# Patient Record
Sex: Female | Born: 1960
Health system: Southern US, Community
[De-identification: ages and names within clinical notes are randomized; demographics above are authoritative.]

## PROBLEM LIST (undated history)

## (undated) DIAGNOSIS — K219 Gastro-esophageal reflux disease without esophagitis: Secondary | ICD-10-CM

## (undated) DIAGNOSIS — F419 Anxiety disorder, unspecified: Secondary | ICD-10-CM

## (undated) DIAGNOSIS — J189 Pneumonia, unspecified organism: Secondary | ICD-10-CM

## (undated) DIAGNOSIS — R51 Headache: Secondary | ICD-10-CM

## (undated) DIAGNOSIS — M1990 Unspecified osteoarthritis, unspecified site: Secondary | ICD-10-CM

## (undated) DIAGNOSIS — F329 Major depressive disorder, single episode, unspecified: Secondary | ICD-10-CM

## (undated) DIAGNOSIS — M797 Fibromyalgia: Secondary | ICD-10-CM

## (undated) DIAGNOSIS — U071 COVID-19: Secondary | ICD-10-CM

## (undated) DIAGNOSIS — J45909 Unspecified asthma, uncomplicated: Secondary | ICD-10-CM

## (undated) DIAGNOSIS — F32A Depression, unspecified: Secondary | ICD-10-CM

## (undated) HISTORY — PX: TONSILLECTOMY: SUR1361

## (undated) HISTORY — PX: TUBAL LIGATION: SHX77

## (undated) HISTORY — PX: OTHER SURGICAL HISTORY: SHX169

## (undated) HISTORY — PX: REFRACTIVE SURGERY: SHX103

## (undated) HISTORY — PX: TOE SURGERY: SHX1073

## (undated) HISTORY — PX: GANGLION CYST EXCISION: SHX1691

---

## 1998-09-16 ENCOUNTER — Other Ambulatory Visit: Admission: RE | Admit: 1998-09-16 | Discharge: 1998-09-16 | Payer: Self-pay | Admitting: *Deleted

## 2000-06-29 ENCOUNTER — Other Ambulatory Visit: Admission: RE | Admit: 2000-06-29 | Discharge: 2000-06-29 | Payer: Self-pay | Admitting: *Deleted

## 2001-09-11 ENCOUNTER — Other Ambulatory Visit: Admission: RE | Admit: 2001-09-11 | Discharge: 2001-09-11 | Payer: Self-pay | Admitting: *Deleted

## 2002-03-06 ENCOUNTER — Encounter: Admission: RE | Admit: 2002-03-06 | Discharge: 2002-03-06 | Payer: Self-pay | Admitting: Rheumatology

## 2002-03-06 ENCOUNTER — Encounter: Payer: Self-pay | Admitting: Rheumatology

## 2003-02-01 ENCOUNTER — Ambulatory Visit (HOSPITAL_COMMUNITY): Admission: RE | Admit: 2003-02-01 | Discharge: 2003-02-01 | Payer: Self-pay | Admitting: Gastroenterology

## 2003-06-05 ENCOUNTER — Encounter: Admission: RE | Admit: 2003-06-05 | Discharge: 2003-06-05 | Payer: Self-pay | Admitting: Rheumatology

## 2003-06-05 ENCOUNTER — Encounter: Payer: Self-pay | Admitting: Rheumatology

## 2004-10-05 ENCOUNTER — Emergency Department (HOSPITAL_COMMUNITY): Admission: EM | Admit: 2004-10-05 | Discharge: 2004-10-05 | Payer: Self-pay | Admitting: Emergency Medicine

## 2004-10-05 ENCOUNTER — Ambulatory Visit: Payer: Self-pay | Admitting: Psychiatry

## 2004-10-05 ENCOUNTER — Inpatient Hospital Stay (HOSPITAL_COMMUNITY): Admission: RE | Admit: 2004-10-05 | Discharge: 2004-10-07 | Payer: Self-pay | Admitting: Psychiatry

## 2005-06-10 ENCOUNTER — Other Ambulatory Visit: Admission: RE | Admit: 2005-06-10 | Discharge: 2005-06-10 | Payer: Self-pay | Admitting: Obstetrics and Gynecology

## 2005-07-14 ENCOUNTER — Ambulatory Visit: Payer: Self-pay | Admitting: Pulmonary Disease

## 2005-07-28 ENCOUNTER — Ambulatory Visit: Payer: Self-pay | Admitting: Pulmonary Disease

## 2005-08-10 ENCOUNTER — Ambulatory Visit: Payer: Self-pay | Admitting: Pulmonary Disease

## 2005-10-27 ENCOUNTER — Ambulatory Visit: Payer: Self-pay | Admitting: Pulmonary Disease

## 2006-01-20 ENCOUNTER — Ambulatory Visit: Payer: Self-pay | Admitting: Pulmonary Disease

## 2006-06-13 ENCOUNTER — Other Ambulatory Visit: Admission: RE | Admit: 2006-06-13 | Discharge: 2006-06-13 | Payer: Self-pay | Admitting: Obstetrics and Gynecology

## 2007-03-22 ENCOUNTER — Ambulatory Visit: Payer: Self-pay | Admitting: Pulmonary Disease

## 2007-05-01 ENCOUNTER — Ambulatory Visit: Payer: Self-pay | Admitting: Pulmonary Disease

## 2007-05-15 ENCOUNTER — Ambulatory Visit: Payer: Self-pay | Admitting: Pulmonary Disease

## 2007-06-27 ENCOUNTER — Other Ambulatory Visit: Admission: RE | Admit: 2007-06-27 | Discharge: 2007-06-27 | Payer: Self-pay | Admitting: Obstetrics and Gynecology

## 2007-07-11 ENCOUNTER — Ambulatory Visit: Payer: Self-pay | Admitting: Critical Care Medicine

## 2007-07-11 DIAGNOSIS — J45909 Unspecified asthma, uncomplicated: Secondary | ICD-10-CM

## 2007-07-11 DIAGNOSIS — J310 Chronic rhinitis: Secondary | ICD-10-CM

## 2007-08-07 ENCOUNTER — Ambulatory Visit: Payer: Self-pay | Admitting: Pulmonary Disease

## 2007-08-10 ENCOUNTER — Telehealth (INDEPENDENT_AMBULATORY_CARE_PROVIDER_SITE_OTHER): Payer: Self-pay | Admitting: *Deleted

## 2007-09-11 ENCOUNTER — Encounter: Payer: Self-pay | Admitting: Pulmonary Disease

## 2007-11-17 ENCOUNTER — Ambulatory Visit: Payer: Self-pay | Admitting: Family Medicine

## 2007-11-17 ENCOUNTER — Inpatient Hospital Stay (HOSPITAL_COMMUNITY): Admission: AD | Admit: 2007-11-17 | Discharge: 2007-11-19 | Payer: Self-pay | Admitting: Family Medicine

## 2007-11-28 ENCOUNTER — Ambulatory Visit: Payer: Self-pay | Admitting: Pulmonary Disease

## 2008-08-05 ENCOUNTER — Other Ambulatory Visit: Admission: RE | Admit: 2008-08-05 | Discharge: 2008-08-05 | Payer: Self-pay | Admitting: Obstetrics and Gynecology

## 2009-08-07 ENCOUNTER — Other Ambulatory Visit: Admission: RE | Admit: 2009-08-07 | Discharge: 2009-08-07 | Payer: Self-pay | Admitting: Obstetrics and Gynecology

## 2010-05-06 ENCOUNTER — Ambulatory Visit: Payer: Self-pay | Admitting: Internal Medicine

## 2010-05-07 ENCOUNTER — Telehealth (INDEPENDENT_AMBULATORY_CARE_PROVIDER_SITE_OTHER): Payer: Self-pay | Admitting: *Deleted

## 2010-06-05 ENCOUNTER — Telehealth (INDEPENDENT_AMBULATORY_CARE_PROVIDER_SITE_OTHER): Payer: Self-pay | Admitting: *Deleted

## 2010-06-09 DIAGNOSIS — M069 Rheumatoid arthritis, unspecified: Secondary | ICD-10-CM | POA: Insufficient documentation

## 2010-06-22 ENCOUNTER — Ambulatory Visit: Payer: Self-pay | Admitting: Pulmonary Disease

## 2010-08-13 ENCOUNTER — Other Ambulatory Visit
Admission: RE | Admit: 2010-08-13 | Discharge: 2010-08-13 | Payer: Self-pay | Source: Home / Self Care | Admitting: Obstetrics and Gynecology

## 2010-10-06 NOTE — Assessment & Plan Note (Signed)
Summary: Pulmonary/ acute ext ov with hfa 75% p coach start dulera 100  a  Copy to:  Zenovia Jordan Primary Provider/Referring Provider:  Dr. Renford Dills  CC:  Acute visit.  Pt c/o chest congestion and cough x 1 wk- cough is prod with green sputum.  She also c/o increased SOB x 3-4 days with or without any exertion.  Had some chest soreness a few days ago but this has resolved.  Marland Kitchen  History of Present Illness: 58 yowf never regular smoker with asthma as child then recurred in 20's with course complicated by RA intermittently requiring prednisone and difficulty affording symbicort which had been working well.    May 06, 2010  Acute visit.  Pt c/o chest congestion and cough x 1 wk- cough is prod with green sputum.  She also c/o increased SOB x 3-4 days with or without any exertion.  Had some chest soreness a few days ago but this has resolved.   taking omeprazole at hs. did not realize she could take tramadol for cough. using albuterol as a maintenance drug and no longer on symbicort, can't afford it.    Presently  denies any significant sore throat, dysphagia, itching, sneezing,  nasal congestion or excess secretions,  fever, chills, sweats, unintended wt loss, pleuritic or exertional cp, hempoptysis,  orthopnea pnd or leg swelling. Pt also denies any obvious fluctuation in symptoms with weather or environmental change or other alleviating or aggravating factors.       Current Medications (verified): 1)  Omeprazole 20 Mg Cpdr (Omeprazole) .Marland Kitchen.. 1 Once Daily 2)  Alprazolam 0.5 Mg  Tabs (Alprazolam) .... Take One Tab By Mouth At Bedtime 3)  Lexapro 10 Mg  Tabs (Escitalopram Oxalate) .... Take One Tab By Mouth Once Daily 4)  Trazodone Hcl 50 Mg  Tabs (Trazodone Hcl) .... Take Three Tabs By Mouth At Bedtime 5)  Albuterol Sulfate (2.5 Mg/61ml) 0.083%  Nebu (Albuterol Sulfate) .... One Per Nebulizer Qid 6)  Citalopram Hydrobromide 40 Mg Tabs (Citalopram Hydrobromide) .Marland Kitchen.. 1 1/2 At Bedtime 7)   Prednisone 5 Mg Tabs (Prednisone) .Marland Kitchen.. 1 Once Daily 8)  Kineret 100 Mg/0.41ml Soln (Anakinra) .Marland Kitchen.. 1 Inj Daily 9)  Plaquenil 200 Mg Tabs (Hydroxychloroquine Sulfate) .... 2 Once Daily 10)  Activella 0.5-0.1 Mg Tabs (Estradiol-Norethindrone Acet) .Marland Kitchen.. 1 Once Daily 11)  Tramadol Hcl 50 Mg Tabs (Tramadol Hcl) .Marland Kitchen.. 1 To 2 Every 8 Hrs As Needed  Allergies (verified): 1)  Penicillin  Past History:  Past Medical History: Asthma................................................................Marland KitchenSood    - HFA 75% p coaching May 06, 2010 Rheumatoid Arthritis.............................................Marland KitchenHawkes  Vital Signs:  Patient profile:   50 year old female Weight:      198.50 pounds BMI:     35.29 O2 Sat:      96 % on Room air Temp:     97.0 degrees F oral Pulse rate:   84 / minute BP sitting:   110 / 80  (left arm)  Vitals Entered By: Vernie Murders (May 06, 2010 9:50 AM)  O2 Flow:  Room air  Physical Exam  Additional Exam:  wt 165 > 198 May 06, 2010  obese amb wf with barking quality upper airway cough HEENT mild turbinate edema.  Oropharynx no thrush or excess pnd or cobblestoning.  No JVD or cervical adenopathy. Mild accessory muscle hypertrophy. Trachea midline, nl thryroid. Chest was hyperinflated by percussion with diminished breath sounds and moderate increased exp time without wheeze. Hoover sign positive at mid inspiration. Regular rate and  rhythm without murmur gallop or rub or increase P2 or edema.  Abd: no hsm, nl excursion. Ext warm without cyanosis or clubbing.     CXR  Procedure date:  05/06/2010  Findings:      Findings: Heart and mediastinal contours are within normal limits. No focal opacities or effusions.  No acute bony abnormality.   IMPRESSION: No acute cardiopulmonary disease.  Impression & Recommendations:  Problem # 1:  ASTHMA (ICD-493.90) Recurrent flares ? etiology DDX of  difficult airways managment all start with A and  include  Adherence, Ace Inhibitors, Acid Reflux, Active Sinus Disease, Alpha 1 Antitripsin deficiency, Anxiety masquerading as Airways dz,  ABPA,  allergy(esp in young), Aspiration (esp in elderly), Adverse effects of DPI,  Active smokers, plus one B  = Beta blocker use..   Adherence:  In this case Adherence is the biggest issue and starts with  inability to use HFA effectively and also  understand that SABA treats the symptoms but doesn't get to the underlying problem (inflammation).  I used  the ananology of putting steroid cream on a rash to help explain the meaning of topical therapy and the need to get the drug to the target tissue.   I spent extra time with the patient today explaining optimal mdi  technique.  This improved from  50-75% p coaching  ? Acid reflux:  using ppi at hs.  See instructions for specific recommendations   ? Active sinus dz > consider sinus ct if recurs on new regimen  Each maintenance medication was reviewed in detail including most importantly the difference between maintenance prns and under what circumstances the prns are to be used.  In addition, these two groups (for which the patient should keep up with refills) were distinguished from a third group :  meds that are used only short term with the intent to complete a course of therapy and then not refill them.  The med list was then fully reconciled and reorganized to reflect this important distinction.  See instructions for specific recommendations     Medications Added to Medication List This Visit: 1)  Omeprazole 20 Mg Cpdr (Omeprazole) .Marland Kitchen.. 1 once daily 2)  Omeprazole 20 Mg Cpdr (Omeprazole) .... Take  one 30-60 min before first meal of the day 3)  Dulera 100-5 Mcg/act Aero (Mometasone furo-formoterol fum) .... 2 puffs first thing  in am and 2 puffs again in pm about 12 hours later 4)  Citalopram Hydrobromide 40 Mg Tabs (Citalopram hydrobromide) .Marland Kitchen.. 1 1/2 at bedtime 5)  Kineret 100 Mg/0.81ml Soln (Anakinra) .Marland Kitchen.. 1 inj  daily 6)  Plaquenil 200 Mg Tabs (Hydroxychloroquine sulfate) .... 2 once daily 7)  Activella 0.5-0.1 Mg Tabs (Estradiol-norethindrone acet) .Marland Kitchen.. 1 once daily 8)  Tramadol Hcl 50 Mg Tabs (Tramadol hcl) .Marland Kitchen.. 1 to 2 every 4  hrs as needed for pain or cough 9)  Tramadol Hcl 50 Mg Tabs (Tramadol hcl) .Marland Kitchen.. 1 to 2 every 8 hrs as needed 10)  Albuterol Sulfate (2.5 Mg/76ml) 0.083% Nebu (Albuterol sulfate) .... Up to one every 4 hours  if needed for short of breath 11)  Avelox 400 Mg Tabs (Moxifloxacin hcl) .... One  daily x 6 days 12)  Prednisone 5 Mg Tabs (Prednisone) .... As per written instructions 13)  Prednisone 5 Mg Tabs (Prednisone) .Marland Kitchen.. 1 once daily  Other Orders: T-2 View CXR (71020TC) Est. Patient Level IV (65784) HFA Instruction [phone])  Patient Instructions: 1)  when coughing increase omeprazole Take one 30-60 min  before first and last meals of the day and pepcid 20 mg at bedtime  2)  GERD (REFLUX)  is a common cause of respiratory symptoms. It commonly presents without heartburn and can be treated with medication, but also with lifestyle changes including avoidance of late meals, excessive alcohol, smoking cessation, and avoid fatty foods, chocolate, peppermint, colas, red wine, and acidic juices such as orange juice. NO MINT OR MENTHOL PRODUCTS SO NO COUGH DROPS  3)  USE SUGARLESS CANDY INSTEAD (jolley ranchers)  4)  NO OIL BASED VITAMINS  5)  Start  dulera 100 2 puffs first thing  in am and 2 puffs again in pm about 12 hours later and see if you can afford it with the coupon 6)  for cough, use tramadol 7)  for wheeze, short of breath use neb albuterol but the goal is to use it less that twice daily 8)  Prednisone 5 mg 4 daily until better, then 2 daily x 3 days and 1 daily for 3 days and stop if you're taking it for resp symtoms 9)    Think of your medications in 3  categories and keep them separate:  10)  a)  The ones you take no matter what daily on a scheduled basis 11)  b)  The  ones you only take if needed for specific problems 12)  c)   The ones you take for a short course and stop, like antibiotics and prednisone. 13)  Please schedule a follow-up appointment in 6 weeks, sooner if needed with Dr Craige Cotta 14)    Prescriptions: ALBUTEROL SULFATE (2.5 MG/3ML) 0.083%  NEBU (ALBUTEROL SULFATE) up to one every 4 hours  if needed for short of breath  #25 x 11   Entered and Authorized by:   Nyoka Cowden MD   Signed by:   Nyoka Cowden MD on 05/06/2010   Method used:   Print then Give to Patient   RxID:   [id number]

## 2010-10-06 NOTE — Progress Notes (Signed)
Summary: sample of proair  Phone Note Call from Patient Call back at Home Phone [phone]   Caller: Patient Call For: sood Summary of Call: pt requests samples of albuterol "inhaler" (not for neb) Initial call taken by: Tivis Ringer, CNA,  June 05, 2010 10:44 AM  Follow-up for Phone Call        VS, please advise if pt can get a sample of proair/ventolin/proventil hfa.  There isn't one listed on her current med list.  Thank you.  Aundra Millet Reynolds LPN  June 05, 2010 11:02 AM   Additional Follow-up for Phone Call Additional follow up Details #1::        Please give her a sample of albuterol inhaler (any one that we have in sample closet is fine). Additional Follow-up by: Coralyn Helling MD,  June 05, 2010 11:28 AM    Additional Follow-up for Phone Call Additional follow up Details #2::    called and spoke with pt.  pt aware 1 sample of Proair left at front desk for pt to pick up.  Arman Filter LPN  June 05, 2010 11:39 AM   Proair  Lot # [id number]  Exp Nov 2012

## 2010-10-06 NOTE — Assessment & Plan Note (Signed)
Summary: 6 weeks/apc   Copy to:  Zenovia Jordan Primary Provider/Referring Provider:  Dr. Renford Dills  CC:  6 weeks follow up. Pt states breathing is better, little wheezing, and SOB with activity. Requesting flu vaccine.  History of Present Illness: 50 yo female with asthma and rheumatoid arthritis.  She has been doing better.  She does not have much cough.  She gets occasional wheeze.  She is not having chest pain, fever, sputum, hemoptysis, or sinus congestion.  Her arthritis has been stable on her current regimen.  She has not needed to use her proair much.  She is concerned about whether she can afford her inhaler medications.  Preventive Screening-Counseling & Management  Alcohol-Tobacco     Smoking Status: quit  Current Medications (verified): 1)  Omeprazole 20 Mg Cpdr (Omeprazole) .... Take  One 30-60 Min Before First Meal of The Day 2)  Dulera 100-5 Mcg/act Aero (Mometasone Furo-Formoterol Fum) .... 2 Puffs First Thing  in Am and 2 Puffs Again in Pm About 12 Hours Later 3)  Trazodone Hcl 50 Mg  Tabs (Trazodone Hcl) .... Take Three Tabs By Mouth At Bedtime 4)  Citalopram Hydrobromide 40 Mg Tabs (Citalopram Hydrobromide) .Marland Kitchen.. 1 1/2 At Bedtime 5)  Kineret 100 Mg/0.27ml Soln (Anakinra) .Marland Kitchen.. 1 Inj Daily 6)  Plaquenil 200 Mg Tabs (Hydroxychloroquine Sulfate) .... 2 Once Daily 7)  Activella 0.5-0.1 Mg Tabs (Estradiol-Norethindrone Acet) .Marland Kitchen.. 1 Once Daily 8)  Tramadol Hcl 50 Mg Tabs (Tramadol Hcl) .Marland Kitchen.. 1 To 2 Every 4  Hrs As Needed For Pain or Cough 9)  Alprazolam 0.5 Mg  Tabs (Alprazolam) .... Take One Tab By Mouth At Bedtime 10)  Albuterol Sulfate (2.5 Mg/31ml) 0.083%  Nebu (Albuterol Sulfate) .... Up To One Every 4 Hours  If Needed For Short of Breath 11)  Prednisone 5 Mg Tabs (Prednisone) .... As Per Written Instructions  Allergies (verified): 1)  Penicillin  Past History:  Past Medical  History: Asthma................................................................Marland KitchenSood Rheumatoid Arthritis.............................................Marland KitchenNickola Major  Family History: Family History Diabetes-Father, brother Family History Hypertension-mother Family History Hyperlipidemia-mother Family History Sarcoidosis-sister deceased @ 43 Family History Pulmonary Fibrosis-sister deceased @ 46 CHF-father, brother  Social History: Marital Status: married Children: 2 Occupation: Retired Health and safety inspector  Patient states former smoker. (1 pack a week x 6 months)  Vital Signs:  Patient profile:   50 year old female Height:      63 inches Weight:      203.4 pounds BMI:     36.16 O2 Sat:      96 % on Room air Temp:     98.0 degrees F oral Pulse rate:   79 / minute BP sitting:   110 / 82  (left arm) Cuff size:   large  Vitals Entered By: Zackery Barefoot CMA (June 22, 2010 3:08 PM)  O2 Flow:  Room air CC: 6 weeks follow up. Pt states breathing is better, little wheezing, SOB with activity. Requesting flu vaccine Comments Medications reviewed with patient Verified contact number and pharmacy with patient. Zackery Barefoot CMA  June 22, 2010 3:09 PM    Physical Exam  General:  normal appearance and obese.   Nose:  narrow nasal angles, no tenderness, no discharge Mouth:  no exudate Neck:  no JVD.   Lungs:  clear bilaterally to auscultation and percussion Heart:  regular rate and rhythm, S1, S2 without murmurs, rubs, gallops, or clicks Extremities:  no clubbing, cyanosis, edema, or deformity noted Neurologic:  normal CN II-XII and strength normal.   Cervical Nodes:  no significant adenopathy   Impression & Recommendations:  Problem # 1:  ASTHMA (ICD-493.90) She is stable at present.  She is concerned about the expense of her medication.  I will have her complete her current prescription for dulera, and then stop dulera.  I will start her on zafirlukast as this is generic.  I  explained that inhaled steroids are first line therapy for asthma, and that if her symptoms get worse with change in medicine she will need to restart inhaled steroids.  Advised her to call if her symptoms get worse.  Will give her flu shot today.  Problem # 2:  RHEUMATOID ARTHRITIS (ICD-714.0) She is to f/u with Dr. Nickola Major.  Medications Added to Medication List This Visit: 1)  Zafirlukast 20 Mg Tabs (Zafirlukast) .... One two times a day  Complete Medication List: 1)  Omeprazole 20 Mg Cpdr (Omeprazole) .... Take  one 30-60 min before first meal of the day 2)  Dulera 100-5 Mcg/act Aero (Mometasone furo-formoterol fum) .... 2 puffs first thing  in am and 2 puffs again in pm about 12 hours later 3)  Trazodone Hcl 50 Mg Tabs (Trazodone hcl) .... Take three tabs by mouth at bedtime 4)  Citalopram Hydrobromide 40 Mg Tabs (Citalopram hydrobromide) .Marland Kitchen.. 1 1/2 at bedtime 5)  Kineret 100 Mg/0.47ml Soln (Anakinra) .Marland Kitchen.. 1 inj daily 6)  Plaquenil 200 Mg Tabs (Hydroxychloroquine sulfate) .... 2 once daily 7)  Activella 0.5-0.1 Mg Tabs (Estradiol-norethindrone acet) .Marland Kitchen.. 1 once daily 8)  Tramadol Hcl 50 Mg Tabs (Tramadol hcl) .Marland Kitchen.. 1 to 2 every 4  hrs as needed for pain or cough 9)  Alprazolam 0.5 Mg Tabs (Alprazolam) .... Take one tab by mouth at bedtime 10)  Albuterol Sulfate (2.5 Mg/52ml) 0.083% Nebu (Albuterol sulfate) .... Up to one every 4 hours  if needed for short of breath 11)  Prednisone 5 Mg Tabs (Prednisone) .... As per written instructions 12)  Zafirlukast 20 Mg Tabs (Zafirlukast) .... One two times a day  Other Orders: Est. Patient Level IV (16109)  Patient Instructions: 1)  Finish current prescription for dulera 2)  Flu shot today 3)  Zafirlukast 20 mg two times a day 4)  Proair two puffs as needed 5)  follow up in 6 months Prescriptions: ZAFIRLUKAST 20 MG TABS (ZAFIRLUKAST) one two times a day  #60 x 6   Entered and Authorized by:   Coralyn Helling MD   Signed by:   Coralyn Helling MD on  06/22/2010   Method used:   Electronically to        [facility]  [place] [address] (retail)       [address]       [place], [place]  [address]       Ph: [phone] or [phone]       Fax: [phone]   RxID:   [id number]    Immunization History:  Influenza Immunization History:    Influenza:  historical (07/07/2009)    Appended Document: Orders Update    Clinical Lists Changes  Orders: Added new Service order of Admin 1st Vaccine (66440) - Signed Added new Service order of Flu Vaccine 71yrs + [phone]) - Signed Observations: Added new observation of FLU VAX VIS: 03/31/10 version (06/22/2010 16:25) Added new observation of FLU VAXLOT: [id number] (06/22/2010 16:25) Added new observation of FLU VAXMFR: [employer] (06/22/2010 16:25) Added new observation of FLU VAX EXP: 03/06/2011 (06/22/2010 16:25) Added new observation of FLU VAX DSE: 0.27ml (06/22/2010 16:25) Added new  observation of FLU VAX: Fluvax 3+ (06/22/2010 16:25)Flu Vaccine Consent Questions     Do you have a history of severe allergic reactions to this vaccine? no    Any prior history of allergic reactions to egg and/or gelatin? no    Do you have a sensitivity to the preservative Thimersol? no    Do you have a past history of Guillan-Barre Syndrome? no    Do you currently have an acute febrile illness? no    Have you ever had a severe reaction to latex? no    Vaccine information given and explained to patient? yes    Are you currently pregnant? no    Lot Number:[id number]   Exp Date:03/06/2011 Zackery Barefoot CMA  June 22, 2010 4:28 PM    Site Given  Right Deltoid IMw observation of FLU VAX DSE: 0.16ml (06/22/2010 16:25) Added new observation of FLU VAX: Fluvax 3+ (06/22/2010 16:25)     .lbflu

## 2010-10-06 NOTE — Progress Notes (Signed)
Summary: returning call to [facility] for cxr results  Phone Note Call from Patient Call back at Home Phone [phone]   Caller: Patient Call For: wert Summary of Call: returning a call to leslie Initial call taken by: Lacinda Axon,  May 07, 2010 12:43 PM  Follow-up for Phone Call        called spoke with patient, advised of cxr results that [facility] ATC her about.  cxr ok per MW, pt verbalized her understanding. Boone Master CNA/MA  May 07, 2010 2:27 PM

## 2011-01-19 NOTE — Assessment & Plan Note (Signed)
[facility]                             PULMONARY OFFICE NOTE   Lori [name], Lori [name]                       MRN:          [id number]  DATE:08/07/2007                            DOB:          1960/10/21    I saw the patient today in follow-up for her asthma and rhinitis.  Since  her last visit with me, she was seen by my nurse practitioner, Rubye Oaks, NP on August 04, 2007, and she apparently had been treated  for a left lower lobe pneumonia with a course of Avelox and prednisone.  She says that since then her symptoms have improved.  She still does  have some nasal congestion.  She says that she tried using a nebulizer  then which she said worked better than her inhaler.  She is currently  using her Ventolin 2-3 times a day.  She is also still having problems  with cough with production of clear to yellowish sputum and does  complain of dyspnea with activity such as vacuuming.   CURRENT MEDICATIONS:  1. Ventolin HFA two puffs q.i.d.  2. Nexium 40 mg daily.  3. Xanax 0.5 mg q.h.s.  4. Atrovent 250/50 one puff b.i.d.  5. Humira.  6. Lexapro 10 mg daily.  7. Trazodone 150 mg q.h.s.  8. Topamax 300 mg daily.  9. Veramyst.  10.Xyzal 5 mg daily.  11.Sulfasalazine.   PHYSICAL EXAMINATION:  VITAL SIGNS:  She is 166 pounds, temperature  98.2, blood pressure 112/80, heart rate 65, and oxygen saturation is 98%  on room air.  HEENT:  No sinus tenderness, no nasal discharge.  No oral lesions and no  lymphadenopathy.  HEART:  S1 and S2.  CHEST:  No wheezing or rales.  ABDOMEN:  Soft and nontender.  EXTREMITIES:  No edema.   Chest x-ray in my office showed no acute disease process.   IMPRESSION:  1. Asthma.  I will change her to Symbicort 160/4.5 two puffs b.i.d.      and have her discontinue her Advair.  She is to continue on      Ventolin HFA as needed.  I will also provide her with a      nebulizer as she did seem to do somewhat better with  this.  2. Rhinitis.  She is to continue on Xyzal and Veramyst.   I will follow up with her in approximately four months.     Coralyn Helling, MD  Electronically Signed    VS/MedQ  DD: 08/08/2007  DT: 08/08/2007  Job #: [id number]

## 2011-01-19 NOTE — Discharge Summary (Signed)
[name], [name]                ACCOUNT NO.:  [id number]   MEDICAL RECORD NO.:  [id number]          PATIENT TYPE:  INP   LOCATION:  5156                         FACILITY:  [facility]   PHYSICIAN:  Leighton Roach McDiarmid, M.D.DATE OF BIRTH:  1961-04-03   DATE OF ADMISSION:  11/17/2007  DATE OF DISCHARGE:  11/19/2007                               DISCHARGE SUMMARY   DISCHARGE DIAGNOSES:  1. Abdominal wall cellulitis, no abscess.  2. Rheumatoid arthritis, recently on Humira.  3. Depression/anxiety.  4. Asthma.  5. Migraines.   DISCHARGE MEDICATIONS:  1. Septra Double Strength, 1 tab p.o. b.i.d. times 8 days.  2. Cipro 750 mg, 1 tab p.o. t.i.d. for 9 days.  3. Trazodone 50 mg p.o. nightly.  4. Topamax 200 mg p.o. nightly.  5. Symbicort, 1 puff  b.i.d.  6. Ventolin inhaled p.r.n.  7. Lexapro 20 mg p.o. daily.  8. Xanax 0.25 mg p.o. nightly.  9. Percocet 5/325, 1 to 2 tabs p.o. q.6. hours p.r.n. pain.   FOLLOWUP:  Patient will see Dr. [name] on Monday, November 20, 2007.  The  patient will see Leann at [facility] on Tuesday,  March 17th and will call for an appointment.   ACUTE FOLLOWUP ISSUES:  The patient was being treated for cellulitis  which is improving with decreased erythema and induration.  Dr. [name]  and Geoffery Spruce should begin to pull out the Iodoform packing daily and  eventually remove it completely so the wound heals.  The patient is to  continue antibiotics.  Cultures are pending at [facility] currently and she  is doubly covered for presumed MRSA and/or Pseudomonas until cultures  are final.   HOSPITAL COURSE:  This is a 50 year old female who was admitted for  cellulitis on her abdomen.  She was started on IV antibiotics as she  failed outpatient antibiotics.  Throughout her hospital course, she has  done very well.  Please see following for details to describe her  course:  1. Cellulitis:  The cellulitis initially was pretty extensive and she      was  started on IV vancomycin after failing outpatient Bactrim.  We      then added Cipro p.o. to double cover her for Pseudomonas as she      was immunocompromised on her Humira medicine.  The patient's      cellulitis improved remarkably within 2 days.  We did obtain an      abdominal ultrasound to test for abscess and it was negative.      Therefore, the patient did not need further incision and drainage.      On day of discharge,  the cellulitis was much improved and we      transitioned her to p.o. antibiotics that she will continue for a      total of 10 days in the outpatient setting.  We discontinued her      Humira given the fact that she has an infection and her primary      care Merland Holness can start it when necessary.  Cultures are pending at  [place] so we are treating her for both MRSA infection  as well as      Pseudomonas.  The patient will follow up at [facility] as well.  The      patient's white blood cell count trended down during hospital stay      and was normal at day of discharge.  2. Rheumatoid arthritis:  The patient was on Humira which we held      during this hospital stay given her infectious state.  No other      changes were made.  Patient to follow up with primary care      Perlie Scheuring.  3. Depression/Anxiety:  Continue the patient's Lexapro and Xanax.  No      problems.  4. Asthma:  Continue the patient's Symbicort as well as her Albuterol      inhaler p.r.n.  No exacerbations during this hospital stay.  5. Migraines:  We continued the patient's Topamax nightly, no problems      with that.   IMAGES:  The patient had an abdominal ultrasound which showed no  abscess.   LABS:  Pertinent:  The patient's white cell count initially elevated at  12.3, 7.8 at discharge.  Hemoglobin normal.  All other labs were within  normal limits.  Cultures are pending at [facility].   The patient was discharged in improved and stable condition with close  followup.      Johney Maine, M.D.  Electronically Signed      Leighton Roach McDiarmid, M.D.  Electronically Signed    JT/MEDQ  D:  11/19/2007  T:  11/19/2007  Job:  [id number]   cc:   Demetria Pore. [name], M.D.  Sigmund I. Patsi Sears, M.D.  ATT: [facility]

## 2011-01-19 NOTE — H&P (Signed)
Lori [name], Lori [name]                ACCOUNT NO.:  [id number]   MEDICAL RECORD NO.:  [id number]          PATIENT TYPE:  INP   LOCATION:  5156                         FACILITY:  [facility]   PHYSICIAN:  Leighton Roach McDiarmid, M.D.DATE OF BIRTH:  01-May-1961   DATE OF ADMISSION:  11/17/2007  DATE OF DISCHARGE:                              HISTORY & PHYSICAL   PRIMARY CARE PHYSICIAN:  [name], M.D.   CHIEF COMPLAINT:  Abdominal abscess and cellulitis.   HISTORY OF PRESENT ILLNESS:  This is a 50 year old white female with  severe rheumatoid arthritis on Humira and sulfasalazine who presented on  November 15, 2007, to [facility] with lower abdominal skin  infection that started on Sunday, November 12, 2007.  At that time, the  patient had an area of erythema on her lower abdomen which measured  approximately 4 x 5 inches with a central area of induration but no  fluctuance, amenable to drainage.  The patient was started on Bactrim  double strength 1 tablet p.o. b.i.d. at that time.  Today, November 17, 2007, the patient returned to [facility] for follow for worsened erythema  and increased induration.  Dr. [name] reports the patient now has an area  of erythema measuring 27 x 12 cm with a central area of induration  measuring approximately 8 x 8 cm.  The abscess was incised and drained  in the office at [facility] with wound cultures sent, prior to the patient  being sent for admission for IV antibiotic therapy.  The patient reports  associated increased weakness and fatigue as well as warmth to the site  of her infection over the last two days.  She denies any frank fever or  chills.  Of note, the patient does report history of frequent  folliculitis type lesions.  Denies any household contacts with frequent  skin infections.   REVIEW OF SYSTEMS:  The patient denies fever, chills, sore throat,  congestion.  She does report a dry cough for the last two weeks chest,  which is improving.  She denies  any chest pain, shortness of breath,  nausea, vomiting, diarrhea, abdominal pain, hematuria, dysuria or rash,  headache, numbness, weakness or tingling.   PAST MEDICAL HISTORY:  1. Severe rheumatoid arthritis, managed by Dr. [name].  2. Migraines.  3. Depression/anxiety.  4. Asthma.  5. Status post multiple joint surgeries.   ALLERGIES:  PENICILLIN.   MEDICATIONS:  1. Trazodone 50 mg p.o. nightly.  2. Septra double strength 1 tablet p.o. b.i.d. since November 15, 2007.  3. Topamax 2 mg p.o. nightly.  4. Symbicort 1 puff inhaled b.i.d.  5. Ventolin 1 puff as needed.  6. Humira  injection every two weeks on Mondays.  The patient skipped      her most recent dose secondary to her infection.  7. Lexapro 20 mg p.o. nightly.  8. Xanax 0.25 mg p.o. nightly.  9. Azulfidine twice daily (dose unknown).   FAMILY HISTORY:  Significant for diabetes on both her mother and  father's side.  The patient's father had an MI at age 58  after a CABG in  his 1s, her sister has sarcoidosis.  She has three other siblings, one  of which has diabetes and peripheral arterial disease.   SOCIAL HISTORY:  The patient lives with her husband, daughter, son-in-  law and grandson.  She is on disability for her rheumatoid arthritis.  She denies any tobacco, alcohol or drug use.   PHYSICAL EXAMINATION:  VITAL SIGNS:  Temperature is 98.1, heart rate  120, respiratory rate 18, blood pressure 124/84, oxygen saturation 96%  on room air.  GENERAL APPEARANCE:  The patient is alert and oriented x3 in no acute  distress.  HEENT:  Head is normocephalic and atraumatic.  Pupils are equal, round  and reactive to light and accommodation.  Extraocular movements are  intact.  The patient has moist mucous membranes.  NECK:  Supple and nontender.  CARDIOVASCULAR:  Heart has regular rate and rhythm with no murmurs, rubs  or gallops and 2+ dorsalis pedis pulses bilaterally.  LUNGS:  Clear to auscultation bilaterally.  Normal  work of breathing and  no wheezes, rales or rhonchi.  ABDOMEN:  Normoactive bowel sounds, is soft, nontender and nondistended  with the exception of exquisite tenderness to palpation at the infection  site.  EXTREMITIES:  No clubbing, cyanosis or edema and only minimal rheumatoid  changes.  SKIN:  No rash.  Lower abdomen shows a large approximately 27 x 12 cm  area of erythema with central area of induration status post incision  and drainage with wick placement and dressing.  This area is exquisitely  tender to palpation.  NEUROLOGICAL:  Cranial nerves II-XII are grossly intact.  The patient  has normal sensation throughout and 5/5 strength throughout. She is  alert and oriented x3.   LABORATORY DATA:  CBC and basic metabolic panel are pending.  Wound  cultures obtained at [facility] today are also pending.   ASSESSMENT/PLAN:  This is a 36-year-old white female with likely  methicillin resistant Staphylococcus aureus abscess and browning  cellulitis which failed two days of oral Bactrim therapy.   1. Abscess and cellulitis:  The patient was only on Bactrim double      strength 1 tablet p.o. b.i.d. for two days.  However, given her      immunocompromised state on Humira therapy for her rheumatoid      arthritis, will admit for intravenous antibiotic therapy with      vancomycin for at least 24 hours.  The patient is already status      post incision and drainage for definitive treatment of the abscess      at [facility] earlier today.  Wound cultures are pending.      Will check CBC and BMET.  If patient continues to improve, will      consider restarting Bactrim double strength at two tablets p.o.      b.i.d. after at least 24 hours of intravenous antibiotics.  Will      give Percocet p.r.n. for pain as the patient is on Darvocet at home      and this has not been working adequately in the last few days.  2. Rheumatoid arthritis:  Will hold Humira given the patient's  acute      infection.  We will wait for the patient's daughter to bring in her      medications for Azulfidine dose.  3. Depression anxiety:  Will continue the patient's home medications.  4. Asthma:  Will continue the patient's  home medications.  5. Fluids, electrolytes, nutrition, gastrointestinal:  Will give the      patient regular diet and check basic metabolic panel to ensure      electrolytes are stable and renal function is normal.   DISPOSITION:  Pending improvement of #1.      Drue Dun, M.D.  Electronically Signed      Leighton Roach McDiarmid, M.D.  Electronically Signed    EE/MEDQ  D:  11/17/2007  T:  11/19/2007  Job:  [id number]

## 2011-01-19 NOTE — Assessment & Plan Note (Signed)
[facility]                             PULMONARY OFFICE NOTE   BIJAL, SIGLIN                       MRN:          [id number]  DATE:07/11/2007                            DOB:          11-Sep-1960    HISTORY OF PRESENT ILLNESS:  The patient is a 50 year old white female  of Dr. Craige Cotta who has a known history of asthma and rhinitis.  She  presents today for an acute office visit.  The patient complains that  over the last two weeks she had progressively worsening cough and  congestion, and was seen at Urgent Care on July 06, 2007.  She was  diagnosed with a left lower lobe pneumonia and started on Avelox for 10  days and a prednisone taper.  The patient reports that her symptoms are  much improved.  Coughing and congestion has improved substantially.  The  patient denies any hemoptysis, orthopnea, PND, or leg swelling.   PAST MEDICAL HISTORY:  Reviewed.   CURRENT MEDICATIONS:  Reviewed.   PHYSICAL EXAMINATION:  GENERAL:  The patient is a pleasant female in no  acute distress.  VITAL SIGNS:  She is afebrile with stable vital signs.  O2 saturations  98% on room air.  HEENT:  Unremarkable.  NECK:  Supple without cervical adenopathy.  LUNGS:  Sounds are clear without any wheezing or crackles.  HEART:  Regular rate and rhythm.  ABDOMEN:  Soft and nontender.  EXTREMITIES:  Warm without any calf cyanosis, clubbing, or edema.   IMPRESSION:  Recently diagnosed left lower lobe pneumonia.  The patient  does have a copy of her chest x-ray and we will review accordingly.  The  patient is to follow back up with Dr. Craige Cotta as scheduled in three weeks  and at that time will have a follow-up chest x-ray to verify resolution  of her left lower lobe infiltrate.  The patient is recommended to finish  her antibiotics and prednisone as recommended and follow back up with  Dr. Craige Cotta as scheduled.      Rubye Oaks, NP  Electronically Signed      Coralyn Helling, MD  Electronically Signed   TP/MedQ  DD: 07/11/2007  DT: 07/12/2007  Job #: [id number]

## 2011-01-19 NOTE — Assessment & Plan Note (Signed)
[facility]                             PULMONARY OFFICE NOTE   Lori [name], Lori [name]                       MRN:          [id number]  DATE:03/22/2007                            DOB:          10/21/1960    PULMONARY FOLLOWUP VISIT   I saw Lori [name] today in followup for her asthma.  She says that the  past several weeks, she has been noticing worsening of her breathing,  associated with chest tightness and cough productive of clear to yellow  sputum.  She has also been having sinus congestion with post-nasal drip.  She denied having any fevers, chills, or sweats.  She also denies any  abdominal pain, nausea, vomiting, or diarrhea.  She is not having any  leg swelling or skin rashes.   CURRENT MEDICATIONS:  1. Nexium 40 mg b.i.d.  2. Xanax 0.5 mg nightly  3. Advair 250/50 one puff b.i.d.  4. Lexapro 10 mg daily.  5. Trazodone nightly.  6. Ventolin HFA 3 to 4 times a day.  7. Topamax 300 mg daily.  8. Humira every 2 weeks.  9. Prednisone 7.5 mg daily.   PHYSICAL EXAM:  She is 180 pounds, temperature 97.8, blood pressure  106/68.  Heart rate is 65, oxygen saturation 99% on room air.  HEENT:  Mild maxillary sinus tenderness with a clear nasal discharge.  There was mild erythema to the posterior pharynx.  There is no  lymphadenopathy or thyromegaly.  She has a moon facies.  HEART:  S1, S2.  CHEST:  There are coarse breath sounds bilaterally, but no wheezing or  rales.  ABDOMEN:  Obese, soft, and nontender.  EXTREMITIES:  No edema.   CHEST X-RAY:  In my office today showed increased bronchial markings,  but no acute infiltrates.   IMPRESSION:  1. Acute asthmatic bronchitis.  I will give her a course of      azithromycin over 5 days.  I will also have her increase her dose      of prednisone to 40 mg and taper this over the next 5 days back to      her baseline dose of prednisone of 7.5 mg.  I would have her      continue on her inhaler regimen  of Advair 250/50 one puff b.i.d.      and Ventolin HFA.  I have given her samples of Advair, and I have      also given her samples of Proventil HFA, which I have advised her      she can use interchangeably with the Ventolin.  2. Symptoms of sleep apnea.  I discussed this with her again.  She      says that, due to feelings of claustrophobia, she is very reluctant      to undergo any evaluation for sleep apnea.  I have advised her to      reconsider this, and I will discuss this with her further at her      next followup.   I plan on following up with her in 2 months, but have  advised her to  call me earlier if her symptoms do not improve.     Coralyn Helling, MD  Electronically Signed    VS/MedQ  DD: 03/22/2007  DT: 03/23/2007  Job #: [id number]   cc:   Demetria Pore. [name], M.D.

## 2011-01-19 NOTE — Assessment & Plan Note (Signed)
[facility]                             PULMONARY OFFICE NOTE   Lori [name], Lori [name]                       MRN:          [id number]  DATE:05/15/2007                            DOB:          04-Feb-1961    SUBJECTIVE:  I saw Lori [name] today in followup for her asthma and  rhinitis.   She says that her symptoms have improved since she was started on  Veramyst and Xyzal as well as using the nasal sprays. She is still  having some coughing with production of white sputum but denies any  hemoptysis.  She is not having any problems as far as chest tightness or  wheezing.  She says that her sore throat has improved since she started  to rinse her mouth out after using the Advair.   MEDICATIONS:  Her medications were reviewed.   PHYSICAL EXAMINATION:  VITAL SIGNS:  She is 181 pounds.  Temperature  97.9. Blood pressure 90/70.  Heart rate is 82.  Oxygen saturation 95% on  room air.  HEENT:  There is no sinus tenderness.  There is no discharge.  There are  no oral lesions.  NECK:  No lymphadenopathy.  HEART:  S1, S2.  CHEST:  Clear to auscultation.  ABDOMEN:  Obese, soft, nontender.  EXTREMITIES: No edema.   IMPRESSION:  1. Asthma.  I will continue her on Advair 250/50 one puff b.i.d. and      ProAir HFA two puffs q.i.d. p.r.n.  2. Rhinitis.  She is to continue on the use of her Veramyst.  I have      instructed her on the use of nasal irrigation and she is to      continue with the use of Xyzal.  3. Possible sleep apnea.  She says that she is sleeping better at the      present time and would prefer to defer having a sleep test.   I will follow up with her in approximately four to six months.     Coralyn Helling, MD  Electronically Signed    VS/MedQ  DD: 05/15/2007  DT: 05/16/2007  Job #: [id number]   cc:   Lori [name]. Lori [name], M.D.

## 2011-01-19 NOTE — Assessment & Plan Note (Signed)
[facility]                             PULMONARY OFFICE NOTE   FAIGE, SEELY                       MRN:          [id number]  DATE:05/01/2007                            DOB:          July 25, 1961    HISTORY OF PRESENT ILLNESS:  The patient is a 50 year old white female  patient of Dr. Evlyn Courier who has a history of asthmatic bronchitis who  presents today for persistent cough and congestion.  The patient was  seen here 6 weeks ago with similar symptoms.  At that time, she was  given a Z-Pak and a prednisone burst.  The patient has a history of  rheumatoid arthritis and is on chronic steroids presently at 10 mg.  She  was also on Humira every 2 weeks.  The patient reports that her symptoms  did totally resolve for about 2 to 3 weeks.  However, over the last 3  weeks, the symptoms slowly returned.  The patient has had a couple of  episodes in which she had some green-tinged sputum.  However, this has  been inconsistent.  The patient denies any hemoptysis, orthopnea, PND,  or leg swelling.  The patient does have frequent post-nasal drip with  intermittent nose bleeds and frequent throat clearing.   PAST MEDICAL HISTORY:  Reviewed.   CURRENT MEDICATIONS:  Reviewed.   PHYSICAL EXAM:  The patient is a pleasant female in no acute distress.  She is afebrile with stable vital signs.  O2 saturation is 100% on room  air.  HEENT:  Nasal mucosa is erythematous.  Nontender sinuses.  Conjunctivae  not injected.  TMs normal.  Posterior pharynx is clear.  NECK:  Supple without cervical adenopathy.  The patient has frequent  throat-clearing throughout the exam.  No JVD.  LUNGS:  Sounds are clear without any wheezing or crackles.  CARDIAC:  Regular rate and rhythm.  ABDOMEN:  Soft and nontender.  EXTREMITIES:  Warm without any edema.   IMPRESSION AND PLAN:  Acute rhinitis flare.  The patient is to add in  Veramyst nasal spray 1 puff twice daily along with Xyzal  5 mg at  bedtime, along with saline nasal spray.  The patient  may use Mucinex DM or Delsym as needed for cough and congestion.  The  patient will return back with Dr. Craige Cotta in 2 weeks or sooner if needed.     Rubye Oaks, NP  Electronically Signed      Coralyn Helling, MD  Electronically Signed   TP/MedQ  DD: 05/01/2007  DT: 05/02/2007  Job #: [id number]

## 2011-01-22 NOTE — Op Note (Signed)
NAME:  Lori [name], Lori [name]                          ACCOUNT NO.:  [id number]   MEDICAL RECORD NO.:  [id number]                   PATIENT TYPE:  AMB   LOCATION:  ENDO                                 FACILITY:  [facility]   PHYSICIAN:  Danise Edge, M.D.                DATE OF BIRTH:  05-15-1961   DATE OF PROCEDURE:  02/01/2003  DATE OF DISCHARGE:                                 OPERATIVE REPORT   INDICATIONS FOR PROCEDURE:  The patient is a 50 year old female born on  Jul 04, 1961.  The patient has sero-positive nonnodular erosive  rheumatoid arthritis.  She is undergoing diagnostic  esophagogastroduodenoscopy and colonoscopy to evaluate unexplained weight  loss.   PROCEDURE:  Esophagogastroduodenoscopy and colonoscopy.   ENDOSCOPIST:  Danise Edge, M.D.   MEDICATIONS:  Versed 5 mg, Demerol 50 mg.   DESCRIPTION OF PROCEDURE:  Diagnostic esophagogastroduodenoscopy.  After obtaining informed consent,  the patient was placed in the left lateral decubitus position.  I  administered intravenous Demerol and intravenous Versed to achieve conscious  sedation for the procedure.  The patient's blood pressure, oxygen  saturation, and cardiac rhythm were monitored throughout the procedure and  documented in the medical records.  The Olympus gastroscope was passed  through the posterior hypopharynx and into the proximal esophagus without  difficulty.  The hypopharynx and larynx appeared normal.  I did not  visualize the vocal cords.   Esophagoscopy.  The proximal, mid, and lower segments of the esophageal  mucosa appear normal.   Gastroscopy.  Retroflexed view of the gastric cardia and fundus were normal.  The gastric body, antrum, and pyloris appears normal.   Duodenoscopy.  The duodenal bulb, mid duodenum, and distal duodenum appear  normal.   ASSESSMENT:  Normal esophagogastroduodenoscopy.   DESCRIPTION OF PROCEDURE:  Colonoscopy.  Anal inspection was normal.  Digital rectal  examination was normal.  The Olympus pediatric video  colonoscope was introduced into the rectum and it was advanced to the cecum.  A normal appearing ileocecal valve was intubated and the distal ileum  inspected.  Colonic preparation for the examination today was excellent.   Rectum normal.   Sigmoid colon and descending colon normal.   Splenic flexure normal.   Transverse colon normal.   Hepatic flexure normal.   Ascending colon normal.   Cecum and ileocecal valve normal.   Distal ileum normal.   ASSESSMENT:  Normal proctocolonoscopy to the cecum with distal ileal  inspection.  No endoscopic evidence for the presence of colorectal  neoplasia.                                               Danise Edge, M.D.    MJ/MEDQ  D:  02/01/2003  T:  02/02/2003  Job:  [id number]  cc:   Demetria Pore. [name], M.D.  [address]  [address]  [place]  [place] [address]  Fax: [phone]

## 2011-01-22 NOTE — H&P (Signed)
NAMENANNIE, STARZYK NO.:  [id number]   MEDICAL RECORD NO.:  [id number]          PATIENT TYPE:  IPS   LOCATION:  0504                          FACILITY:  [facility]   PHYSICIAN:  Geoffery Lyons, M.D.      DATE OF BIRTH:  [birth year]   DATE OF ADMISSION:  10/05/2004  DATE OF DISCHARGE:                         PSYCHIATRIC ADMISSION ASSESSMENT   This is a 50 year old voluntarily admitted on October 05, 2004.   HISTORY OF PRESENT ILLNESS:  The patient presents with a history of  depression for approximately one year, reports this Monday, prior to  admission, the patient was thinking about taking her pills, feeling very  overwhelmed with her medical problems.  The patient has a history of  rheumatoid arthritis.  The patient states that she needed to take some  paperwork to her doctor to get disability.  She does not want to get  disability, but she has been missing a significant amount of work, and they  are encouraging her to go ahead and apply for that.  She states that she  does not want to hurt herself.  She got very worried that she was having  those thoughts.  She has a very supportive family and her faith would  prevent her from self-harm.  She has only been sleeping about 3-4 hours per  night.  Her appetite has been satisfactory.  She denies any psychotic  symptoms.   PAST PSYCHIATRIC HISTORY:  First admission to [facility].  No  history of a suicide attempt.  She sees Valinda Hoar, N.P. as an  outpatient.   SOCIAL HISTORY:  She is a 50 year old married white female, married for 25  years with 2 children, 22 and 24.  She lives with her husband.  She has been  working for years at the post office.  No legal charges.   FAMILY HISTORY:  Brother with depression.   ALCOHOL AND DRUG HISTORY:  Nonsmoker.  Denies any alcohol or drug use.   PRIMARY CARE Treyvin Glidden:  Dr. [name], her rheumatologist in [place].  The  patient also has a neurologist, Dr.  Vela Prose.   MEDICAL PROBLEMS:  Rheumatoid arthritis and migraines.   MEDICATIONS:  Has been on:  1.  Wellbutrin XL 300 mg since December 2005.  2.  Lexapro 40 mg, has been on it for approximately 6-8 months.  3.  Topamax 100 mg in the morning, 300 mg at bedtime for prophylactic      migraine control.  4.  Darvocet for pain.  5.  Nexium 40 mg daily.  6.  Maxalt for headache pain.  7.  Humira injections once a week, taking them on Wednesday.   In the past, the patient has been on Zoloft, which she found effective.   DRUG ALLERGIES:  PENICILLIN.   The patient was assessed at [facility].  The patient is a middle  aged female in no acute distress, well-nourished, tearful.  Her temperature  today is 97.6, 69 heart rate, 20 respirations, blood pressure 128/80, 140  pounds, 5 feet 2 inches tall.  CBC is within normal  limits.  CMET within  normal limits.  Urine drug screen is negative.  Urinalysis is negative.   MENTAL STATUS EXAMINATION:  She is an alert middle-aged female, cooperative,  in no acute distress.  Speech is clear.  Patient is depressed.  Patient is  tearful throughout most of the interview.  Thought processes are coherent,  goal-directed.  No evidence of psychosis.  Cognitive function intact.  Memory is good.  Judgment and insight are fair.  She appears sincere.   AXIS I:  1.  Depressive disorder, not otherwise specified.  2.  Rule out adjustment disorder.   AXIS II:  Deferred.   AXIS III:  1.  Rheumatoid arthritis.  2.  Migraines headaches.   AXIS IV:  Problems with occupation, medical problems, possibly some other  psychosocial problems with loss of identity with loss of job role and other  adjustments with the patient's medical illness.   AXIS V:  Global assessment of functioning currently is 35 and past year is  70.   PLAN:  Admission for suicidal thoughts.  Contract for safety.  Stabilize  mood and thinking.  We will decrease the patient's Lexapro;  increased dosing  of Lexapro seems to be ineffective for the patient's depression.  We will  taper Lexapro and add Cymbalta.  We will encourage the patient to take 10 mg  of Ambien for sleep, as 5 mg is only offering patient about 3-4 hours of  sleep.  We will consider a family session with husband for support and  education.  Patient may need some individual therapy through her adjustment  and her disability.  The patient is to follow up with Valinda Hoar, N.P.,  her primary care Chrisa Hassan, and her current specialists for her medical  problems.   TENTATIVE LENGTH OF STAY:  Three to four days.      JO/MEDQ  D:  10/07/2004  T:  10/07/2004  Job:  [id number]

## 2011-01-22 NOTE — Discharge Summary (Signed)
NAMEJAYLAA, GALLION NO.:  [id number]   MEDICAL RECORD NO.:  [id number]          PATIENT TYPE:  IPS   LOCATION:  0504                          FACILITY:  [facility]   PHYSICIAN:  Geoffery Lyons, M.D.      DATE OF BIRTH:  [birth year]   DATE OF ADMISSION:  10/05/2004  DATE OF DISCHARGE:  10/07/2004                                 DISCHARGE SUMMARY   CHIEF COMPLAINT AND PRESENT ILLNESS:  This was the first admission to [facility] for this 50 year old female voluntarily admitted.  History of depression for a year.  Prior to admission, she was thinking  about taking her pills, feeling very overwhelmed, medical problems, history  of rheumatoid arthritis.  Endorsed that she needed to take some paperwork to  her doctor to get disability.  Se did not want to get disability.  She has  been missing a significant amount of work.  They were encouraging to go  ahead and apply for it.  Endorsed she did not want to hurt herself.  Got  very worried that she was having those thoughts.  Very supportive family.  Sleeping 3-4 hours per night.   PAST PSYCHIATRIC HISTORY:  First time at [facility].  No history of  suicide attempts.  Sees Valinda Hoar as an outpatient.   ALCOHOL/DRUG HISTORY:  Denies any active alcohol or substance use.   MEDICAL HISTORY:  Rheumatoid arthritis and migraines.   MEDICATIONS:  Wellbutrin XL 300 mg daily, Lexapro 40 mg daily, Topamax 100  mg in the morning, 200 mg at night, Darvocet as needed for pain, Nexium 40  mg daily, Maxalt for headaches, Humira injection once a week.   PHYSICAL EXAMINATION:  Performed and failed to show any acute findings.   LABORATORY DATA:  TSH 1.751.  Blood chemistries with SGOT 18, SGPT 21,  bilirubin 0.7.  Drug screen negative for substances of abuse.   MENTAL STATUS EXAM:  Alert female.  Cooperative.  No acute distress.  Speech  was clear, normal rate, tempo and production.  Mood was depressed.  Affect  depressed, tearful throughout the interview.  Thought processes were  logical, coherent and relevant.  No psychosis.  No active suicidal or  homicidal ideation.  Cognition was well-preserved.   ADMISSION DIAGNOSES:   AXIS I:  Depressive disorder not otherwise specified.   AXIS II:  No diagnosis.   AXIS III:  1.  Rheumatoid arthritis.  2.  Migraines.   AXIS IV:  Moderate.   AXIS V:  Global Assessment of Functioning upon admission 35; highest Global  Assessment of Functioning in the last year 70.   HOSPITAL COURSE:  She was admitted and started in individual and group  psychotherapy.  She was given Ambien for sleep. She was maintained on the  Darvocet, the Nexium, the Wellbutrin, Lexapro, Topamax, Maxalt and Ativan  0.5 mg every four hours as needed.  Ambien was placed at 10 mg at night as  needed for sleep.  Lexapro was decreased to 20 mg and she was started on  Cymbalta 30 mg per day.  She endorsed that she had been dealing with her  disability, unable to work, grieving the loss of her sister, losing her own  sense of self-worth and value as an __________ person.  Overwhelmed with all  the losses but felt she was safe to go home.  Felt that she was not going to  benefit from staying any longer.  She was willing to pursue the medication  changes.  She was going to continue follow-up with Valinda Hoar.  Endorsing no suicidal or homicidal ideation but the need to be home.   DISCHARGE DIAGNOSES:   AXIS I:  Depressive disorder not otherwise specified.   AXIS II:  No diagnosis.   AXIS III:  1.  Rheumatoid arthritis.  2.  Migraine headaches.   AXIS IV:  Moderate.   AXIS V:  Global Assessment of Functioning upon discharge 50.   DISCHARGE MEDICATIONS:  1.  Topamax 100 mg in the morning and 300 mg at night.  2.  Lexapro 10 mg per day.  3.  Cymbalta 30 mg daily.  4.  Wellbutrin XL 30 mg daily.  5.  Nexium 40 mg daily.  6.  Maxalt as needed for headache.  7.  Humira  injection as directed.  8.  Darvocet as needed for pain.  9.  Ambien 5 mg at night for sleep.   FOLLOW UP:  Valinda Hoar, who is going to optimize treatment with the  medication.  Being discharged on Cymbalta, Wellbutrin and Lexapro with  expectations for most possibly Lexapro to be tapered down and discontinued.     IL/MEDQ  D:  11/03/2004  T:  11/04/2004  Job:  [id number]

## 2011-05-31 LAB — CBC
HCT: 32.9 — ABNORMAL LOW
HCT: 34.1 — ABNORMAL LOW
HCT: 36.1
Hemoglobin: 11.1 — ABNORMAL LOW
Hemoglobin: 11.5 — ABNORMAL LOW
MCV: 93
MCV: 93.9
Platelets: 208
Platelets: 281
RBC: 3.66 — ABNORMAL LOW
RDW: 12.7
RDW: 12.9
WBC: 12.3 — ABNORMAL HIGH
WBC: 7.8

## 2011-05-31 LAB — BASIC METABOLIC PANEL
BUN: 9
BUN: 10
Calcium: 8.8
Chloride: 108
Chloride: 112
Creatinine, Ser: 0.7
GFR calc Af Amer: >60
GFR calc non Af Amer: >60
Glucose, Bld: 84
Glucose, Bld: 117 — ABNORMAL HIGH
Potassium: 4
Potassium: 4.1
Potassium: 4.4
Sodium: 135
Sodium: 136

## 2011-08-18 ENCOUNTER — Other Ambulatory Visit (HOSPITAL_COMMUNITY)
Admission: RE | Admit: 2011-08-18 | Discharge: 2011-08-18 | Disposition: A | Discharge status: Home / Self Care | Payer: Self-pay | Source: Ambulatory Visit | Attending: Obstetrics and Gynecology | Admitting: Obstetrics and Gynecology

## 2011-08-18 ENCOUNTER — Other Ambulatory Visit: Payer: Self-pay | Admitting: Obstetrics and Gynecology

## 2011-08-18 DIAGNOSIS — Z01419 Encounter for gynecological examination (general) (routine) without abnormal findings: Secondary | ICD-10-CM | POA: Insufficient documentation

## 2011-11-29 ENCOUNTER — Other Ambulatory Visit (HOSPITAL_COMMUNITY): Payer: Self-pay | Admitting: *Deleted

## 2011-12-02 ENCOUNTER — Encounter (HOSPITAL_COMMUNITY)
Admission: RE | Admit: 2011-12-02 | Discharge: 2011-12-02 | Disposition: A | Discharge status: Home / Self Care | Payer: Federal, State, Local not specified - PPO | Source: Ambulatory Visit | Attending: Rheumatology | Admitting: Rheumatology

## 2011-12-02 DIAGNOSIS — M069 Rheumatoid arthritis, unspecified: Secondary | ICD-10-CM | POA: Insufficient documentation

## 2011-12-02 MED ORDER — SODIUM CHLORIDE 0.9 % IV SOLN
Freq: Once | INTRAVENOUS | Status: AC
  Administered 2011-12-02: 13:00:00 via INTRAVENOUS

## 2011-12-02 MED ORDER — TOCILIZUMAB 400 MG/20ML IV SOLN
4.0000 mg/kg | INTRAVENOUS | Status: DC
  Administered 2011-12-02: 306 mg via INTRAVENOUS
  Filled 2011-12-02: qty 15.3

## 2011-12-29 ENCOUNTER — Other Ambulatory Visit (HOSPITAL_COMMUNITY): Payer: Self-pay | Admitting: *Deleted

## 2011-12-30 ENCOUNTER — Encounter (HOSPITAL_COMMUNITY)
Admission: RE | Admit: 2011-12-30 | Discharge: 2011-12-30 | Disposition: A | Discharge status: Home / Self Care | Payer: Federal, State, Local not specified - PPO | Source: Ambulatory Visit | Attending: Rheumatology | Admitting: Rheumatology

## 2011-12-30 DIAGNOSIS — M069 Rheumatoid arthritis, unspecified: Secondary | ICD-10-CM | POA: Insufficient documentation

## 2011-12-30 MED ORDER — SODIUM CHLORIDE 0.9 % IV SOLN
Freq: Once | INTRAVENOUS | Status: AC
  Administered 2011-12-30: 250 mL via INTRAVENOUS

## 2011-12-30 MED ORDER — SODIUM CHLORIDE 0.9 % IV SOLN
4.0000 mg/kg | INTRAVENOUS | Status: DC
  Administered 2011-12-30: 306 mg via INTRAVENOUS
  Filled 2011-12-30: qty 15.3

## 2012-01-14 ENCOUNTER — Encounter (HOSPITAL_COMMUNITY): Payer: Federal, State, Local not specified - PPO

## 2012-01-28 ENCOUNTER — Encounter (HOSPITAL_COMMUNITY)
Admission: RE | Admit: 2012-01-28 | Discharge: 2012-01-28 | Disposition: A | Discharge status: Home / Self Care | Payer: Federal, State, Local not specified - PPO | Source: Ambulatory Visit | Attending: Rheumatology | Admitting: Rheumatology

## 2012-01-28 DIAGNOSIS — M069 Rheumatoid arthritis, unspecified: Secondary | ICD-10-CM | POA: Insufficient documentation

## 2012-01-28 MED ORDER — TOCILIZUMAB 400 MG/20ML IV SOLN
4.0000 mg/kg | INTRAVENOUS | Status: DC
  Administered 2012-01-28: 304 mg via INTRAVENOUS
  Filled 2012-01-28: qty 15.2

## 2012-10-09 ENCOUNTER — Other Ambulatory Visit: Payer: Self-pay | Admitting: Obstetrics and Gynecology

## 2012-10-09 ENCOUNTER — Other Ambulatory Visit (HOSPITAL_COMMUNITY)
Admission: RE | Admit: 2012-10-09 | Discharge: 2012-10-09 | Disposition: A | Discharge status: Home / Self Care | Payer: Federal, State, Local not specified - PPO | Source: Ambulatory Visit | Attending: Obstetrics and Gynecology | Admitting: Obstetrics and Gynecology

## 2012-10-09 DIAGNOSIS — Z1151 Encounter for screening for human papillomavirus (HPV): Secondary | ICD-10-CM | POA: Insufficient documentation

## 2012-10-09 DIAGNOSIS — Z01419 Encounter for gynecological examination (general) (routine) without abnormal findings: Secondary | ICD-10-CM | POA: Insufficient documentation

## 2014-04-16 ENCOUNTER — Other Ambulatory Visit: Payer: Self-pay | Admitting: Gastroenterology

## 2014-04-19 ENCOUNTER — Encounter (HOSPITAL_COMMUNITY): Payer: Self-pay | Admitting: Pharmacy Technician

## 2014-04-23 ENCOUNTER — Encounter (HOSPITAL_COMMUNITY): Payer: Self-pay | Admitting: *Deleted

## 2014-04-30 ENCOUNTER — Encounter (HOSPITAL_COMMUNITY): Payer: Self-pay | Admitting: *Deleted

## 2014-04-30 ENCOUNTER — Ambulatory Visit (HOSPITAL_COMMUNITY): Payer: Federal, State, Local not specified - PPO | Admitting: Anesthesiology

## 2014-04-30 ENCOUNTER — Encounter (HOSPITAL_COMMUNITY): Payer: Federal, State, Local not specified - PPO | Admitting: Anesthesiology

## 2014-04-30 ENCOUNTER — Encounter (HOSPITAL_COMMUNITY)
Admission: RE | Disposition: A | Discharge status: Home / Self Care | Payer: Self-pay | Source: Ambulatory Visit | Attending: Gastroenterology

## 2014-04-30 ENCOUNTER — Ambulatory Visit (HOSPITAL_COMMUNITY)
Admission: RE | Admit: 2014-04-30 | Discharge: 2014-04-30 | Disposition: A | Discharge status: Home / Self Care | Payer: Federal, State, Local not specified - PPO | Source: Ambulatory Visit | Attending: Gastroenterology | Admitting: Gastroenterology

## 2014-04-30 DIAGNOSIS — Z885 Allergy status to narcotic agent status: Secondary | ICD-10-CM | POA: Diagnosis not present

## 2014-04-30 DIAGNOSIS — K219 Gastro-esophageal reflux disease without esophagitis: Secondary | ICD-10-CM | POA: Insufficient documentation

## 2014-04-30 DIAGNOSIS — F329 Major depressive disorder, single episode, unspecified: Secondary | ICD-10-CM | POA: Insufficient documentation

## 2014-04-30 DIAGNOSIS — H269 Unspecified cataract: Secondary | ICD-10-CM | POA: Insufficient documentation

## 2014-04-30 DIAGNOSIS — G43109 Migraine with aura, not intractable, without status migrainosus: Secondary | ICD-10-CM | POA: Diagnosis not present

## 2014-04-30 DIAGNOSIS — J45909 Unspecified asthma, uncomplicated: Secondary | ICD-10-CM | POA: Insufficient documentation

## 2014-04-30 DIAGNOSIS — IMO0001 Reserved for inherently not codable concepts without codable children: Secondary | ICD-10-CM | POA: Insufficient documentation

## 2014-04-30 DIAGNOSIS — Z1211 Encounter for screening for malignant neoplasm of colon: Secondary | ICD-10-CM | POA: Insufficient documentation

## 2014-04-30 DIAGNOSIS — F411 Generalized anxiety disorder: Secondary | ICD-10-CM | POA: Insufficient documentation

## 2014-04-30 DIAGNOSIS — F3289 Other specified depressive episodes: Secondary | ICD-10-CM | POA: Insufficient documentation

## 2014-04-30 DIAGNOSIS — M069 Rheumatoid arthritis, unspecified: Secondary | ICD-10-CM | POA: Diagnosis not present

## 2014-04-30 DIAGNOSIS — Z880 Allergy status to penicillin: Secondary | ICD-10-CM | POA: Insufficient documentation

## 2014-04-30 HISTORY — PX: ESOPHAGOGASTRODUODENOSCOPY (EGD) WITH PROPOFOL: SHX5813

## 2014-04-30 HISTORY — DX: Unspecified osteoarthritis, unspecified site: M19.90

## 2014-04-30 HISTORY — DX: Unspecified asthma, uncomplicated: J45.909

## 2014-04-30 HISTORY — DX: Gastro-esophageal reflux disease without esophagitis: K21.9

## 2014-04-30 HISTORY — DX: Anxiety disorder, unspecified: F41.9

## 2014-04-30 HISTORY — DX: Fibromyalgia: M79.7

## 2014-04-30 HISTORY — DX: Depression, unspecified: F32.A

## 2014-04-30 HISTORY — PX: COLONOSCOPY WITH PROPOFOL: SHX5780

## 2014-04-30 HISTORY — DX: Headache: R51

## 2014-04-30 HISTORY — DX: Major depressive disorder, single episode, unspecified: F32.9

## 2014-04-30 SURGERY — COLONOSCOPY WITH PROPOFOL
Anesthesia: Monitor Anesthesia Care

## 2014-04-30 MED ORDER — PROPOFOL 10 MG/ML IV BOLUS
INTRAVENOUS | Status: AC
  Filled 2014-04-30: qty 20

## 2014-04-30 MED ORDER — KETAMINE HCL 10 MG/ML IJ SOLN
INTRAMUSCULAR | Status: DC | PRN
  Administered 2014-04-30: 20 mg via INTRAVENOUS

## 2014-04-30 MED ORDER — LACTATED RINGERS IV SOLN
INTRAVENOUS | Status: DC
  Administered 2014-04-30: 11:00:00 via INTRAVENOUS

## 2014-04-30 MED ORDER — SODIUM CHLORIDE 0.9 % IV SOLN: INTRAVENOUS | Status: DC

## 2014-04-30 MED ORDER — BUTAMBEN-TETRACAINE-BENZOCAINE 2-2-14 % EX AERO
INHALATION_SPRAY | CUTANEOUS | Status: DC | PRN
  Administered 2014-04-30: 1 via TOPICAL

## 2014-04-30 MED ORDER — ONDANSETRON HCL 4 MG/2ML IJ SOLN
INTRAMUSCULAR | Status: DC | PRN
  Administered 2014-04-30: 4 mg via INTRAVENOUS

## 2014-04-30 MED ORDER — LIDOCAINE HCL (CARDIAC) 20 MG/ML IV SOLN
INTRAVENOUS | Status: DC | PRN
  Administered 2014-04-30: 100 mg via INTRAVENOUS

## 2014-04-30 MED ORDER — ONDANSETRON HCL 4 MG/2ML IJ SOLN
INTRAMUSCULAR | Status: AC
  Filled 2014-04-30: qty 2

## 2014-04-30 MED ORDER — PROPOFOL INFUSION 10 MG/ML OPTIME
INTRAVENOUS | Status: DC | PRN
  Administered 2014-04-30: 200 ug/kg/min via INTRAVENOUS

## 2014-04-30 MED ORDER — LIDOCAINE HCL (CARDIAC) 20 MG/ML IV SOLN
INTRAVENOUS | Status: AC
  Filled 2014-04-30: qty 5

## 2014-04-30 MED ORDER — MIDAZOLAM HCL 2 MG/2ML IJ SOLN
INTRAMUSCULAR | Status: AC
  Filled 2014-04-30: qty 2

## 2014-04-30 MED ORDER — MIDAZOLAM HCL 5 MG/5ML IJ SOLN
INTRAMUSCULAR | Status: DC | PRN
  Administered 2014-04-30 (×2): 1 mg via INTRAVENOUS

## 2014-04-30 SURGICAL SUPPLY — 24 items

## 2014-04-30 NOTE — Anesthesia Preprocedure Evaluation (Addendum)
Anesthesia Evaluation  Patient identified by MRN, date of birth, ID band Patient awake    Reviewed: Allergy & Precautions, H&P , NPO status , Patient's Chart, lab work & pertinent test results  Airway Mallampati: II TM Distance: >3 FB Neck ROM: full    Dental no notable dental hx. (+) Teeth Intact, Dental Advisory Given   Pulmonary neg pulmonary ROS, asthma ,  breath sounds clear to auscultation  Pulmonary exam normal       Cardiovascular Exercise Tolerance: Good negative cardio ROS  Rhythm:regular Rate:Normal     Neuro/Psych  Headaches, Anxiety Depression negative neurological ROS  negative psych ROS   GI/Hepatic negative GI ROS, Neg liver ROS, GERD-  Medicated and Controlled,  Endo/Other  negative endocrine ROS  Renal/GU negative Renal ROS  negative genitourinary   Musculoskeletal  (+) Arthritis -, Rheumatoid disorders,    Abdominal   Peds  Hematology negative hematology ROS (+)   Anesthesia Other Findings   Reproductive/Obstetrics negative OB ROS                          Anesthesia Physical Anesthesia Plan  ASA: III  Anesthesia Plan: MAC   Post-op Pain Management:    Induction:   Airway Management Planned:   Additional Equipment:   Intra-op Plan:   Post-operative Plan:   Informed Consent: I have reviewed the patients History and Physical, chart, labs and discussed the procedure including the risks, benefits and alternatives for the proposed anesthesia with the patient or authorized representative who has indicated his/her understanding and acceptance.   Dental Advisory Given  Plan Discussed with: CRNA and Surgeon  Anesthesia Plan Comments:         Anesthesia Quick Evaluation

## 2014-04-30 NOTE — Op Note (Signed)
Procedure: Screening colonoscopy. Diagnostic esophagogastroduodenoscopy to evaluate chronic heartburn. Normal screening colonoscopy and diagnostic esophagogastroduodenoscopy performed on 02/01/2003  Endoscopist: Danise Edge  Premedication: Propofol administered by anesthesia  Procedure: Diagnostic esophagogastroduodenoscopy The patient was placed in the left lateral decubitus position. The Pentax gastroscope was passed through the posterior hypopharynx into the proximal esophagus without difficulty. The hypopharynx, larynx, and vocal cords appeared normal.  Esophagoscopy: The proximal, mid, and lower segments of the esophageal mucosa appeared normal. The squamocolumnar junction was regular in appearance and noted at 36 cm from the incisor teeth.  Gastroscopy: Retroflex view of the gastric cardia and fundus was normal. The gastric body, antrum, and pylorus appeared normal.  Duodenoscopy: The duodenal bulb and descending duodenum appeared normal.  Assessment: Normal esophagogastroduodenoscopy  Procedure: Screening colonoscopy Anal inspection and digital rectal exam were normal. The Pentax pediatric colonoscope was introduced into the rectum and advanced to the cecum. A normal-appearing appendiceal orifice was visualized. A normal-appearing ileocecal valve was intubated and the terminal ileum inspected. Colonic preparation for the exam today was good.  Rectum. Normal. Retroflexed view of the distal rectum normal  Sigmoid colon and descending colon. Normal  Splenic flexure. Normal  Transverse colon. Normal  Hepatic flexure. Normal  Ascending colon. Normal  Cecum and ileocecal valve. Normal  Terminal ileum. Normal  Assessment: Normal screening colonoscopy  Recommendation: Schedule repeat screening colonoscopy in 10 years

## 2014-04-30 NOTE — Anesthesia Postprocedure Evaluation (Signed)
  Anesthesia Post-op Note  Patient: Lori [name]  Procedure(s) Performed: Procedure(s) (LRB): COLONOSCOPY WITH PROPOFOL (N/A) ESOPHAGOGASTRODUODENOSCOPY (EGD) WITH PROPOFOL (N/A)  Patient Location: PACU  Anesthesia Type: MAC  Level of Consciousness: awake and alert   Airway and Oxygen Therapy: Patient Spontanous Breathing  Post-op Pain: mild  Post-op Assessment: Post-op Vital signs reviewed, Patient's Cardiovascular Status Stable, Respiratory Function Stable, Patent Airway and No signs of Nausea or vomiting  Last Vitals:  Filed Vitals:   04/30/14 1230  BP: 136/83  Pulse: 70  Temp:   Resp: 21    Post-op Vital Signs: stable   Complications: No apparent anesthesia complications

## 2014-04-30 NOTE — H&P (Signed)
  Procedure: Repeat screening colonoscopy and diagnostic esophagogastroduodenoscopy to evaluate chronic heartburn. Normal screening colonoscopy and diagnostic esophagogastroduodenoscopy performed on 02/01/2003. The patient could only consume one half of the colonic lavage prep for colonoscopy today.  History: The patient is a 53 year old female born 10-13-1960. She has chronic heartburn and takes omeprazole each evening. When she tries to stop taking omeprazole, her heartburn symptoms recur. She reports no dysphagia. Her diagnostic esophagogastroduodenoscopy was normal in 2004.  The patient underwent a normal screening colonoscopy in may 2004. She is scheduled to undergo a repeat screening colonoscopy today.  Past medical history: Rheumatoid arthritis. Migraine headache syndrome. Gastroesophageal reflux. Depression. Asthmatic bronchitis. Osteopenia. Cataracts. Fibromyalgia syndrome. Tubal ligation. Surgical removal of a left wrist cyst. Retinal tear surgery of the left thigh. Ulnar nerve release surgery. Right knee arthroscopy. Right thumb nodule removed surgically.  Medication allergies: Penicillin. Percocet.  Exam: The patient is alert and lying comfortably on the endoscopy stretcher. Abdomen is soft and nontender to palpation. Lungs are clear to auscultation. Cardiac exam reveals a regular rhythm.  Plan: Proceed with diagnostic esophagogastroduodenoscopy and screening colonoscopy

## 2014-04-30 NOTE — Transfer of Care (Signed)
Immediate Anesthesia Transfer of Care Note  Patient: Lori [name]  Procedure(s) Performed: Procedure(s) (LRB): COLONOSCOPY WITH PROPOFOL (N/A) ESOPHAGOGASTRODUODENOSCOPY (EGD) WITH PROPOFOL (N/A)  Patient Location: PACU  Anesthesia Type: MAC  Level of Consciousness: sedated, patient cooperative and responds to stimulation  Airway & Oxygen Therapy: Patient Spontanous Breathing and Patient connected to face mask oxgen  Post-op Assessment: Report given to PACU RN and Post -op Vital signs reviewed and stable  Post vital signs: Reviewed and stable  Complications: No apparent anesthesia complications

## 2014-05-01 ENCOUNTER — Encounter (HOSPITAL_COMMUNITY): Payer: Self-pay | Admitting: Gastroenterology

## 2016-03-01 DIAGNOSIS — Z79899 Other long term (current) drug therapy: Secondary | ICD-10-CM | POA: Diagnosis not present

## 2016-03-01 DIAGNOSIS — M0579 Rheumatoid arthritis with rheumatoid factor of multiple sites without organ or systems involvement: Secondary | ICD-10-CM | POA: Diagnosis not present

## 2016-03-01 DIAGNOSIS — M2550 Pain in unspecified joint: Secondary | ICD-10-CM | POA: Diagnosis not present

## 2016-03-05 DIAGNOSIS — J45909 Unspecified asthma, uncomplicated: Secondary | ICD-10-CM | POA: Diagnosis not present

## 2016-03-05 DIAGNOSIS — L309 Dermatitis, unspecified: Secondary | ICD-10-CM | POA: Diagnosis not present

## 2016-03-05 DIAGNOSIS — M069 Rheumatoid arthritis, unspecified: Secondary | ICD-10-CM | POA: Diagnosis not present

## 2016-03-05 DIAGNOSIS — F331 Major depressive disorder, recurrent, moderate: Secondary | ICD-10-CM | POA: Diagnosis not present

## 2016-05-20 DIAGNOSIS — Z79899 Other long term (current) drug therapy: Secondary | ICD-10-CM | POA: Diagnosis not present

## 2016-05-20 DIAGNOSIS — M65341 Trigger finger, right ring finger: Secondary | ICD-10-CM | POA: Diagnosis not present

## 2016-05-20 DIAGNOSIS — M0579 Rheumatoid arthritis with rheumatoid factor of multiple sites without organ or systems involvement: Secondary | ICD-10-CM | POA: Diagnosis not present

## 2016-05-20 DIAGNOSIS — M2550 Pain in unspecified joint: Secondary | ICD-10-CM | POA: Diagnosis not present

## 2016-06-28 ENCOUNTER — Encounter: Payer: Self-pay | Admitting: Pulmonary Disease

## 2016-06-28 ENCOUNTER — Ambulatory Visit (INDEPENDENT_AMBULATORY_CARE_PROVIDER_SITE_OTHER): Payer: Federal, State, Local not specified - PPO | Admitting: Pulmonary Disease

## 2016-06-28 VITALS — BP 122/80 | HR 74 | Ht 63.0 in | Wt 180.0 lb

## 2016-06-28 DIAGNOSIS — J45909 Unspecified asthma, uncomplicated: Secondary | ICD-10-CM | POA: Diagnosis not present

## 2016-06-28 MED ORDER — ALBUTEROL SULFATE (2.5 MG/3ML) 0.083% IN NEBU: 2.5000 mg | INHALATION_SOLUTION | Freq: Four times a day (QID) | RESPIRATORY_TRACT | 12 refills | Status: AC | PRN

## 2016-06-28 NOTE — Addendum Note (Signed)
Addended by: Charlott Holler on: 06/28/2016 01:06 PM   Modules accepted: Orders

## 2016-06-28 NOTE — Assessment & Plan Note (Signed)
Due to persistent symptoms, we'll stepup therapy and add inhaled steroid/ LABA combination  trial of BREO once daily Alternatives include Symbicort and Dulera Use albuterol 2 puffs every 6 hours as needed for wheezing  Prescription for nebulizer will be sent to [facility]ME Albuterol nebs every 6 hours as needed for wheezing, in emergency

## 2016-06-28 NOTE — Patient Instructions (Signed)
Trial of BREO once daily Alternatives include Symbicort and Dulera Use albuterol 2 puffs every 6 hours as needed for wheezing  Prescription for nebulizer will be sent to DME Albuterol nebs every 6 hours as needed for wheezing, in emergency

## 2016-06-28 NOTE — Progress Notes (Signed)
Subjective:    Patient ID: Lori [name], female    DOB: 01/29/1961, 55 y.o.   MRN: [id number]  HPI   Chief Complaint  Patient presents with  . Pulmonary Consult    Self-referral.Asthma. C/o sob with exertion x 1 yr.,Rheum. MD wanted to f/u here.Cough-dry usually,green occass., wheezing,midchest tightness,no fcs    55 year old never smoker presents for management of asthma. Her daughter [name], is our patient for asthma She has a history of rheumatoid arthritis for many years and sees Dr. Lelon Mast at [facility]. She is on daily shots of Anakinra and Plaquenil and has had multiple surgeries. She has been noted to be wheezing at least in a couple of rheumatology appointments and needed steroid shots. Her PCP is also mentioned the need for maintenance inhaler and pulmonology referral .Her nebulizer broke a year ago and she would like replacement.  She reports asthma since childhood, flared again in her 30s for 45 years and then stayed dormant again until it flared up a few years ago. She was last seen in our office in our office in 2009 and placed on Advair with some benefit. Her triggers include weather changes, URIs and stress. She lives in a modular home for 20 years 1 years with carpets and does not have any pets  Review of her medication shows numerous antidepressants and anti-inflammatory for rheumatoid arthritis. She lives with her husband and has been disabled for 15 years.  Spirometry shows  normal lung function, no airway obstruction    Past Medical History:  Diagnosis Date  . Anxiety   . Arthritis    rheumatoid  . Asthma   . Depression   . Fibromyalgia   . GERD (gastroesophageal reflux disease)   . Headache(784.0)    migraines   Past Surgical History:  Procedure Laterality Date  . arthroscopic surgery     right knee  . COLONOSCOPY WITH PROPOFOL Lori [name] 04/30/2014   Procedure: COLONOSCOPY WITH PROPOFOL;  Surgeon: Charolett Bumpers, MD;  Location: [facility];   Service: Endoscopy;  Laterality: Lori [name];  . ESOPHAGOGASTRODUODENOSCOPY (EGD) WITH PROPOFOL Lori [name] 04/30/2014   Procedure: ESOPHAGOGASTRODUODENOSCOPY (EGD) WITH PROPOFOL;  Surgeon: Charolett Bumpers, MD;  Location: [facility];  Service: Endoscopy;  Laterality: Lori [name];  . GANGLION CYST EXCISION     left wrist  . REFRACTIVE SURGERY     left eye  . thumb surgery     right thumb arthritic nodule removed  . TOE SURGERY     bilateral feet  . TONSILLECTOMY     age 66  . TUBAL LIGATION    . ulnar nerve replacement     left arm   Allergies  Allergen Reactions  . Penicillins     REACTION: rash    Social History   Social History  . Marital status: Married    Spouse name: Lori [name]  . Number of children: Lori [name]  . Years of education: Lori [name]   Occupational History  . Retired     Counselling psychologist   Social History Main Topics  . Smoking status: Never Smoker  . Smokeless tobacco: Never Used  . Alcohol use No  . Drug use: No  . Sexual activity: Not on file   Other Topics Concern  . Not on file   Social History Narrative  . No narrative on file     Family History  Problem Relation Age of Onset  . Allergies Mother   . Asthma Mother   . Allergies Father   . Heart  disease Father   . Asthma Sister   . Pulmonary fibrosis Sister   . Clotting disorder Sister       Review of Systems neg for any significant sore throat, dysphagia, itching, sneezing, nasal congestion or excess/ purulent secretions, fever, chills, sweats, unintended wt loss, pleuritic or exertional cp, hempoptysis, orthopnea pnd or change in chronic leg swelling.   Also denies presyncope, palpitations, heartburn, abdominal pain, nausea, vomiting, diarrhea or change in bowel or urinary habits, dysuria,hematuria, rash, arthralgias, visual complaints, headache, numbness weakness or ataxia.     Objective:   Physical Exam  Gen. Pleasant, well-nourished, in no distress, normal affect, coughing ENT - no lesions, no post nasal drip Neck: No  JVD, no thyromegaly, no carotid bruits Lungs: no use of accessory muscles, no dullness to percussion, clear without rales or rhonchi  Cardiovascular: Rhythm regular, heart sounds  normal, no murmurs or gallops, no peripheral edema Abdomen: soft and non-tender, no hepatosplenomegaly, BS normal. Musculoskeletal: No deformities, no cyanosis or clubbing Neuro:  alert, non focal       Assessment & Plan:

## 2016-07-01 ENCOUNTER — Telehealth: Payer: Self-pay | Admitting: Pulmonary Disease

## 2016-07-01 MED ORDER — BUDESONIDE-FORMOTEROL FUMARATE 160-4.5 MCG/ACT IN AERO: 2.0000 | INHALATION_SPRAY | Freq: Two times a day (BID) | RESPIRATORY_TRACT | 6 refills | Status: DC

## 2016-07-01 NOTE — Telephone Encounter (Signed)
Called and spoke with pt and she is aware that the refill for the symbicort has been sent to the pharmacy and nothing further is needed.

## 2016-07-01 NOTE — Telephone Encounter (Signed)
Symbicort 160 -2 puffs twice daily

## 2016-07-01 NOTE — Telephone Encounter (Signed)
RA pt called and stated that the symbicort is covered by her insurance.  Please advise the dosage. Thanks

## 2016-07-07 DIAGNOSIS — J4520 Mild intermittent asthma, uncomplicated: Secondary | ICD-10-CM | POA: Diagnosis not present

## 2016-09-13 ENCOUNTER — Other Ambulatory Visit: Payer: Self-pay | Admitting: Family Medicine

## 2016-09-13 ENCOUNTER — Other Ambulatory Visit (HOSPITAL_COMMUNITY)
Admission: RE | Admit: 2016-09-13 | Discharge: 2016-09-13 | Disposition: A | Discharge status: Home / Self Care | Payer: Federal, State, Local not specified - PPO | Source: Ambulatory Visit | Attending: Family Medicine | Admitting: Family Medicine

## 2016-09-13 DIAGNOSIS — Z1151 Encounter for screening for human papillomavirus (HPV): Secondary | ICD-10-CM | POA: Diagnosis not present

## 2016-09-13 DIAGNOSIS — Z01411 Encounter for gynecological examination (general) (routine) with abnormal findings: Secondary | ICD-10-CM | POA: Insufficient documentation

## 2016-09-13 DIAGNOSIS — E559 Vitamin D deficiency, unspecified: Secondary | ICD-10-CM | POA: Diagnosis not present

## 2016-09-13 DIAGNOSIS — Z5181 Encounter for therapeutic drug level monitoring: Secondary | ICD-10-CM | POA: Diagnosis not present

## 2016-09-13 DIAGNOSIS — Z0000 Encounter for general adult medical examination without abnormal findings: Secondary | ICD-10-CM | POA: Diagnosis not present

## 2016-09-13 DIAGNOSIS — M069 Rheumatoid arthritis, unspecified: Secondary | ICD-10-CM | POA: Diagnosis not present

## 2016-09-13 DIAGNOSIS — E785 Hyperlipidemia, unspecified: Secondary | ICD-10-CM | POA: Diagnosis not present

## 2016-09-15 LAB — CYTOLOGY - PAP
Diagnosis: NEGATIVE
HPV: NOT DETECTED

## 2016-09-28 ENCOUNTER — Ambulatory Visit: Payer: Federal, State, Local not specified - PPO | Admitting: Adult Health

## 2016-12-30 DIAGNOSIS — M2550 Pain in unspecified joint: Secondary | ICD-10-CM | POA: Diagnosis not present

## 2016-12-30 DIAGNOSIS — Z79899 Other long term (current) drug therapy: Secondary | ICD-10-CM | POA: Diagnosis not present

## 2016-12-30 DIAGNOSIS — M0579 Rheumatoid arthritis with rheumatoid factor of multiple sites without organ or systems involvement: Secondary | ICD-10-CM | POA: Diagnosis not present

## 2017-02-03 DIAGNOSIS — H2513 Age-related nuclear cataract, bilateral: Secondary | ICD-10-CM | POA: Diagnosis not present

## 2017-02-03 DIAGNOSIS — Z79899 Other long term (current) drug therapy: Secondary | ICD-10-CM | POA: Diagnosis not present

## 2017-02-03 DIAGNOSIS — M0569 Rheumatoid arthritis of multiple sites with involvement of other organs and systems: Secondary | ICD-10-CM | POA: Diagnosis not present

## 2017-02-09 DIAGNOSIS — M25532 Pain in left wrist: Secondary | ICD-10-CM | POA: Diagnosis not present

## 2017-02-09 DIAGNOSIS — M25531 Pain in right wrist: Secondary | ICD-10-CM | POA: Diagnosis not present

## 2017-02-09 DIAGNOSIS — M1732 Unilateral post-traumatic osteoarthritis, left knee: Secondary | ICD-10-CM | POA: Diagnosis not present

## 2017-02-22 ENCOUNTER — Other Ambulatory Visit: Payer: Self-pay | Admitting: Pulmonary Disease

## 2017-04-05 DIAGNOSIS — Z79899 Other long term (current) drug therapy: Secondary | ICD-10-CM | POA: Diagnosis not present

## 2017-04-05 DIAGNOSIS — M2550 Pain in unspecified joint: Secondary | ICD-10-CM | POA: Diagnosis not present

## 2017-04-05 DIAGNOSIS — M0579 Rheumatoid arthritis with rheumatoid factor of multiple sites without organ or systems involvement: Secondary | ICD-10-CM | POA: Diagnosis not present

## 2017-08-02 DIAGNOSIS — M0579 Rheumatoid arthritis with rheumatoid factor of multiple sites without organ or systems involvement: Secondary | ICD-10-CM | POA: Diagnosis not present

## 2017-08-02 DIAGNOSIS — Z79899 Other long term (current) drug therapy: Secondary | ICD-10-CM | POA: Diagnosis not present

## 2017-09-16 DIAGNOSIS — Z6832 Body mass index (BMI) 32.0-32.9, adult: Secondary | ICD-10-CM | POA: Diagnosis not present

## 2017-09-16 DIAGNOSIS — E785 Hyperlipidemia, unspecified: Secondary | ICD-10-CM | POA: Diagnosis not present

## 2017-09-16 DIAGNOSIS — Z0000 Encounter for general adult medical examination without abnormal findings: Secondary | ICD-10-CM | POA: Diagnosis not present

## 2017-09-16 DIAGNOSIS — E559 Vitamin D deficiency, unspecified: Secondary | ICD-10-CM | POA: Diagnosis not present

## 2017-10-12 ENCOUNTER — Other Ambulatory Visit: Payer: Self-pay | Admitting: Pulmonary Disease

## 2018-01-09 DIAGNOSIS — M2550 Pain in unspecified joint: Secondary | ICD-10-CM | POA: Diagnosis not present

## 2018-01-09 DIAGNOSIS — M0579 Rheumatoid arthritis with rheumatoid factor of multiple sites without organ or systems involvement: Secondary | ICD-10-CM | POA: Diagnosis not present

## 2018-01-09 DIAGNOSIS — Z79899 Other long term (current) drug therapy: Secondary | ICD-10-CM | POA: Diagnosis not present

## 2018-01-09 DIAGNOSIS — M25561 Pain in right knee: Secondary | ICD-10-CM | POA: Diagnosis not present

## 2018-03-08 DIAGNOSIS — H608X1 Other otitis externa, right ear: Secondary | ICD-10-CM | POA: Diagnosis not present

## 2018-03-08 DIAGNOSIS — B029 Zoster without complications: Secondary | ICD-10-CM | POA: Diagnosis not present

## 2018-07-13 DIAGNOSIS — M0579 Rheumatoid arthritis with rheumatoid factor of multiple sites without organ or systems involvement: Secondary | ICD-10-CM | POA: Diagnosis not present

## 2018-07-13 DIAGNOSIS — M2550 Pain in unspecified joint: Secondary | ICD-10-CM | POA: Diagnosis not present

## 2018-07-13 DIAGNOSIS — Z79899 Other long term (current) drug therapy: Secondary | ICD-10-CM | POA: Diagnosis not present

## 2018-10-16 DIAGNOSIS — Z0000 Encounter for general adult medical examination without abnormal findings: Secondary | ICD-10-CM | POA: Diagnosis not present

## 2018-10-16 DIAGNOSIS — E559 Vitamin D deficiency, unspecified: Secondary | ICD-10-CM | POA: Diagnosis not present

## 2018-10-16 DIAGNOSIS — E785 Hyperlipidemia, unspecified: Secondary | ICD-10-CM | POA: Diagnosis not present

## 2018-10-16 DIAGNOSIS — Z5181 Encounter for therapeutic drug level monitoring: Secondary | ICD-10-CM | POA: Diagnosis not present

## 2019-01-11 DIAGNOSIS — M2550 Pain in unspecified joint: Secondary | ICD-10-CM | POA: Diagnosis not present

## 2019-01-11 DIAGNOSIS — Z79899 Other long term (current) drug therapy: Secondary | ICD-10-CM | POA: Diagnosis not present

## 2019-01-11 DIAGNOSIS — M0579 Rheumatoid arthritis with rheumatoid factor of multiple sites without organ or systems involvement: Secondary | ICD-10-CM | POA: Diagnosis not present

## 2019-01-16 DIAGNOSIS — N6459 Other signs and symptoms in breast: Secondary | ICD-10-CM | POA: Diagnosis not present

## 2019-01-16 DIAGNOSIS — Z803 Family history of malignant neoplasm of breast: Secondary | ICD-10-CM | POA: Diagnosis not present

## 2019-01-16 DIAGNOSIS — N6310 Unspecified lump in the right breast, unspecified quadrant: Secondary | ICD-10-CM | POA: Diagnosis not present

## 2019-01-16 DIAGNOSIS — Z79899 Other long term (current) drug therapy: Secondary | ICD-10-CM | POA: Diagnosis not present

## 2019-01-16 DIAGNOSIS — M0569 Rheumatoid arthritis of multiple sites with involvement of other organs and systems: Secondary | ICD-10-CM | POA: Diagnosis not present

## 2019-06-25 DIAGNOSIS — M0579 Rheumatoid arthritis with rheumatoid factor of multiple sites without organ or systems involvement: Secondary | ICD-10-CM | POA: Diagnosis not present

## 2019-06-25 DIAGNOSIS — Z79899 Other long term (current) drug therapy: Secondary | ICD-10-CM | POA: Diagnosis not present

## 2019-06-25 DIAGNOSIS — M2550 Pain in unspecified joint: Secondary | ICD-10-CM | POA: Diagnosis not present

## 2019-09-25 DIAGNOSIS — Z79899 Other long term (current) drug therapy: Secondary | ICD-10-CM | POA: Diagnosis not present

## 2019-09-25 DIAGNOSIS — M2550 Pain in unspecified joint: Secondary | ICD-10-CM | POA: Diagnosis not present

## 2019-09-25 DIAGNOSIS — M0579 Rheumatoid arthritis with rheumatoid factor of multiple sites without organ or systems involvement: Secondary | ICD-10-CM | POA: Diagnosis not present

## 2019-11-16 DIAGNOSIS — J4530 Mild persistent asthma, uncomplicated: Secondary | ICD-10-CM | POA: Diagnosis not present

## 2019-11-16 DIAGNOSIS — R5382 Chronic fatigue, unspecified: Secondary | ICD-10-CM | POA: Diagnosis not present

## 2019-11-16 DIAGNOSIS — Z5181 Encounter for therapeutic drug level monitoring: Secondary | ICD-10-CM | POA: Diagnosis not present

## 2019-11-16 DIAGNOSIS — Z0000 Encounter for general adult medical examination without abnormal findings: Secondary | ICD-10-CM | POA: Diagnosis not present

## 2019-11-16 DIAGNOSIS — E785 Hyperlipidemia, unspecified: Secondary | ICD-10-CM | POA: Diagnosis not present

## 2019-11-16 DIAGNOSIS — M069 Rheumatoid arthritis, unspecified: Secondary | ICD-10-CM | POA: Diagnosis not present

## 2019-11-16 DIAGNOSIS — E559 Vitamin D deficiency, unspecified: Secondary | ICD-10-CM | POA: Diagnosis not present

## 2020-01-04 DIAGNOSIS — M25562 Pain in left knee: Secondary | ICD-10-CM | POA: Diagnosis not present

## 2020-01-04 DIAGNOSIS — M0579 Rheumatoid arthritis with rheumatoid factor of multiple sites without organ or systems involvement: Secondary | ICD-10-CM | POA: Diagnosis not present

## 2020-01-04 DIAGNOSIS — Z79899 Other long term (current) drug therapy: Secondary | ICD-10-CM | POA: Diagnosis not present

## 2020-01-04 DIAGNOSIS — M2550 Pain in unspecified joint: Secondary | ICD-10-CM | POA: Diagnosis not present

## 2020-06-17 ENCOUNTER — Encounter (HOSPITAL_COMMUNITY): Payer: Self-pay | Admitting: Pharmacy Technician

## 2020-06-17 ENCOUNTER — Other Ambulatory Visit: Payer: Self-pay

## 2020-06-17 ENCOUNTER — Emergency Department (HOSPITAL_COMMUNITY)
Admission: EM | Admit: 2020-06-17 | Discharge: 2020-06-17 | Disposition: A | Discharge status: Home / Self Care | Payer: Federal, State, Local not specified - PPO | Attending: Emergency Medicine | Admitting: Emergency Medicine

## 2020-06-17 DIAGNOSIS — R110 Nausea: Secondary | ICD-10-CM | POA: Diagnosis not present

## 2020-06-17 DIAGNOSIS — Z5321 Procedure and treatment not carried out due to patient leaving prior to being seen by health care provider: Secondary | ICD-10-CM | POA: Diagnosis not present

## 2020-06-17 DIAGNOSIS — R1031 Right lower quadrant pain: Secondary | ICD-10-CM | POA: Insufficient documentation

## 2020-06-17 LAB — COMPREHENSIVE METABOLIC PANEL
ALT: 22 U/L (ref 0–44)
AST: 26 U/L (ref 15–41)
Albumin: 4 g/dL (ref 3.5–5.0)
Alkaline Phosphatase: 129 U/L — ABNORMAL HIGH (ref 38–126)
Anion gap: 10 (ref 5–15)
BUN: 6 mg/dL (ref 6–20)
CO2: 20 mmol/L — ABNORMAL LOW (ref 22–32)
Calcium: 9.5 mg/dL (ref 8.9–10.3)
Chloride: 107 mmol/L (ref 98–111)
Creatinine, Ser: 0.81 mg/dL (ref 0.44–1.00)
GFR, Estimated: >60 mL/min (ref >60)
Glucose, Bld: 111 mg/dL — ABNORMAL HIGH (ref 70–99)
Potassium: 4.2 mmol/L (ref 3.5–5.1)
Sodium: 137 mmol/L (ref 135–145)
Total Bilirubin: 0.6 mg/dL (ref 0.3–1.2)
Total Protein: 7.5 g/dL (ref 6.5–8.1)

## 2020-06-17 LAB — LIPASE, BLOOD: Lipase: 26 U/L (ref 11–51)

## 2020-06-17 LAB — URINALYSIS, ROUTINE W REFLEX MICROSCOPIC
Bilirubin Urine: NEGATIVE
Glucose, UA: NEGATIVE mg/dL
Hgb urine dipstick: NEGATIVE
Ketones, ur: NEGATIVE mg/dL
Nitrite: NEGATIVE
Protein, ur: NEGATIVE mg/dL
Specific Gravity, Urine: 1.013 (ref 1.005–1.030)
pH: 8 (ref 5.0–8.0)

## 2020-06-17 LAB — CBC
HCT: 37.5 % (ref 36.0–46.0)
Hemoglobin: 11.5 g/dL — ABNORMAL LOW (ref 12.0–15.0)
MCH: 28.8 pg (ref 26.0–34.0)
MCHC: 30.7 g/dL (ref 30.0–36.0)
MCV: 94 fL (ref 80.0–100.0)
Platelets: 375 10*3/uL (ref 150–400)
RBC: 3.99 MIL/uL (ref 3.87–5.11)
RDW: 13.2 % (ref 11.5–15.5)
WBC: 11.2 10*3/uL — ABNORMAL HIGH (ref 4.0–10.5)
nRBC: 0 % (ref 0.0–0.2)

## 2020-06-17 NOTE — ED Notes (Signed)
Pt stated they were leaving  

## 2020-06-17 NOTE — ED Triage Notes (Signed)
Pt here with RLQ abd pain onset yesterday. Reports pain is progressively getting worse. Pt also endorses nausea.

## 2020-06-20 DIAGNOSIS — R1031 Right lower quadrant pain: Secondary | ICD-10-CM | POA: Diagnosis not present

## 2020-06-20 DIAGNOSIS — N200 Calculus of kidney: Secondary | ICD-10-CM | POA: Diagnosis not present

## 2020-06-20 DIAGNOSIS — N281 Cyst of kidney, acquired: Secondary | ICD-10-CM | POA: Diagnosis not present

## 2020-06-20 DIAGNOSIS — Z23 Encounter for immunization: Secondary | ICD-10-CM | POA: Diagnosis not present

## 2020-06-20 DIAGNOSIS — K5730 Diverticulosis of large intestine without perforation or abscess without bleeding: Secondary | ICD-10-CM | POA: Diagnosis not present

## 2020-06-24 ENCOUNTER — Encounter (HOSPITAL_BASED_OUTPATIENT_CLINIC_OR_DEPARTMENT_OTHER): Payer: Self-pay | Admitting: Emergency Medicine

## 2020-06-24 ENCOUNTER — Other Ambulatory Visit: Payer: Self-pay

## 2020-06-24 ENCOUNTER — Emergency Department (HOSPITAL_BASED_OUTPATIENT_CLINIC_OR_DEPARTMENT_OTHER)
Admission: EM | Admit: 2020-06-24 | Discharge: 2020-06-24 | Disposition: A | Discharge status: Home / Self Care | Payer: Federal, State, Local not specified - PPO | Attending: Emergency Medicine | Admitting: Emergency Medicine

## 2020-06-24 ENCOUNTER — Emergency Department (HOSPITAL_COMMUNITY): Payer: Federal, State, Local not specified - PPO

## 2020-06-24 DIAGNOSIS — R197 Diarrhea, unspecified: Secondary | ICD-10-CM

## 2020-06-24 DIAGNOSIS — Z7951 Long term (current) use of inhaled steroids: Secondary | ICD-10-CM | POA: Insufficient documentation

## 2020-06-24 DIAGNOSIS — R109 Unspecified abdominal pain: Secondary | ICD-10-CM | POA: Diagnosis not present

## 2020-06-24 DIAGNOSIS — R1031 Right lower quadrant pain: Secondary | ICD-10-CM | POA: Diagnosis not present

## 2020-06-24 DIAGNOSIS — J45909 Unspecified asthma, uncomplicated: Secondary | ICD-10-CM | POA: Insufficient documentation

## 2020-06-24 DIAGNOSIS — Z20822 Contact with and (suspected) exposure to covid-19: Secondary | ICD-10-CM | POA: Insufficient documentation

## 2020-06-24 DIAGNOSIS — R110 Nausea: Secondary | ICD-10-CM | POA: Diagnosis not present

## 2020-06-24 LAB — BASIC METABOLIC PANEL
Anion gap: 10 (ref 5–15)
BUN: 22 mg/dL — ABNORMAL HIGH (ref 6–20)
CO2: 19 mmol/L — ABNORMAL LOW (ref 22–32)
Calcium: 9 mg/dL (ref 8.9–10.3)
Chloride: 107 mmol/L (ref 98–111)
Creatinine, Ser: 0.9 mg/dL (ref 0.44–1.00)
GFR, Estimated: >60 mL/min (ref >60)
Glucose, Bld: 105 mg/dL — ABNORMAL HIGH (ref 70–99)
Potassium: 4.4 mmol/L (ref 3.5–5.1)
Sodium: 136 mmol/L (ref 135–145)

## 2020-06-24 LAB — CBC WITH DIFFERENTIAL/PLATELET
Abs Immature Granulocytes: 0.03 10*3/uL (ref 0.00–0.07)
Basophils Absolute: 0.1 10*3/uL (ref 0.0–0.1)
Basophils Relative: 1 %
Eosinophils Absolute: 0.3 10*3/uL (ref 0.0–0.5)
Eosinophils Relative: 3 %
HCT: 35 % — ABNORMAL LOW (ref 36.0–46.0)
Hemoglobin: 10.9 g/dL — ABNORMAL LOW (ref 12.0–15.0)
Immature Granulocytes: 0 %
Lymphocytes Relative: 15 %
Lymphs Abs: 1.2 10*3/uL (ref 0.7–4.0)
MCH: 28.9 pg (ref 26.0–34.0)
MCHC: 31.1 g/dL (ref 30.0–36.0)
MCV: 92.8 fL (ref 80.0–100.0)
Monocytes Absolute: 0.7 10*3/uL (ref 0.1–1.0)
Monocytes Relative: 9 %
Neutro Abs: 5.5 10*3/uL (ref 1.7–7.7)
Neutrophils Relative %: 72 %
Platelets: 380 10*3/uL (ref 150–400)
RBC: 3.77 MIL/uL — ABNORMAL LOW (ref 3.87–5.11)
RDW: 13.2 % (ref 11.5–15.5)
WBC: 7.7 10*3/uL (ref 4.0–10.5)
nRBC: 0 % (ref 0.0–0.2)

## 2020-06-24 LAB — RESPIRATORY PANEL BY RT PCR (FLU A&B, COVID)
Influenza A by PCR: NEGATIVE
Influenza B by PCR: NEGATIVE
SARS Coronavirus 2 by RT PCR: NEGATIVE

## 2020-06-24 LAB — URINALYSIS, MICROSCOPIC (REFLEX): RBC / HPF: NONE SEEN RBC/hpf (ref 0–5)

## 2020-06-24 LAB — URINALYSIS, ROUTINE W REFLEX MICROSCOPIC
Bilirubin Urine: NEGATIVE
Glucose, UA: NEGATIVE mg/dL
Hgb urine dipstick: NEGATIVE
Ketones, ur: NEGATIVE mg/dL
Nitrite: NEGATIVE
Protein, ur: NEGATIVE mg/dL
Specific Gravity, Urine: 1.02 (ref 1.005–1.030)
pH: 5.5 (ref 5.0–8.0)

## 2020-06-24 MED ORDER — IOHEXOL 300 MG/ML  SOLN
100.0000 mL | Freq: Once | INTRAMUSCULAR | Status: AC | PRN
  Administered 2020-06-24: 100 mL via INTRAVENOUS

## 2020-06-24 MED ORDER — ONDANSETRON HCL 4 MG PO TABS: 4.0000 mg | ORAL_TABLET | Freq: Four times a day (QID) | ORAL | 1 refills | Status: AC | PRN

## 2020-06-24 MED ORDER — SODIUM CHLORIDE 0.9 % IV SOLN: Freq: Once | INTRAVENOUS | Status: DC

## 2020-06-24 NOTE — ED Triage Notes (Signed)
Pt arrives pov with c/o RLQ pain and diarrhea for over a week. Pt endorses going to ED with diarrhea on Tuesday, LWBS. Endorses dx with appendicitis by PCP on Thursday

## 2020-06-24 NOTE — ED Notes (Addendum)
Pt ambulatory to restroom, slow gait

## 2020-06-24 NOTE — ED Notes (Signed)
Notified [facility] of need for general surgery consult.

## 2020-06-24 NOTE — ED Notes (Signed)
Patient has discharge orders. Pt appears to have left without AVS and discharge instructions. Pt did not inform staff that patient was leaving.

## 2020-06-24 NOTE — ED Provider Notes (Signed)
[facility] EMERGENCY DEPARTMENT Provider Note   CSN: [id number] Arrival date & time: 06/24/20  4098     History Chief Complaint  Patient presents with  . Abdominal Pain    Lori [name] is a 59 y.o. female.  She has a history of immune suppression secondary to her rheumatoid arthritis medications.  She said she started with diarrhea about a week ago associated with some midepigastric abdominal pain that has slowly migrated to her right lower quadrant.  She went to [facility] but ultimately left before being seen.  She had an outpatient CAT scan done 10/15 that showed probable early appendicitis.  Her doctor referred her on to general surgery and she has not seen them yet but she is experiencing more right lower quadrant abdominal pain so they referred her to the emergency department.  Denies any fevers.  Mild nausea.  Still having some intermittent diarrhea nonbloody.  No urinary symptoms.  The history is provided by the patient.  Abdominal Pain Pain location:  RLQ Pain quality: aching   Pain radiates to:  Does not radiate Pain severity:  Moderate Onset quality:  Gradual Duration:  1 week Timing:  Intermittent Progression:  Partially resolved Chronicity:  New Context: not recent travel, not sick contacts and not trauma   Relieved by:  Nothing Worsened by:  Nothing Ineffective treatments:  None tried Associated symptoms: diarrhea and nausea   Associated symptoms: no anorexia, no chest pain, no chills, no constipation, no cough, no dysuria, no fever, no hematemesis, no hematochezia, no hematuria, no shortness of breath, no sore throat, no vaginal bleeding, no vaginal discharge and no vomiting        Past Medical History:  Diagnosis Date  . Anxiety   . Arthritis    rheumatoid  . Asthma   . Depression   . Fibromyalgia   . GERD (gastroesophageal reflux disease)   . Headache(784.0)    migraines    Patient Active Problem List   Diagnosis Date Noted  . RHEUMATOID  ARTHRITIS 06/09/2010  . RHINITIS 07/11/2007  . Asthma 07/11/2007    Past Surgical History:  Procedure Laterality Date  . arthroscopic surgery     right knee  . COLONOSCOPY WITH PROPOFOL N/A 04/30/2014   Procedure: COLONOSCOPY WITH PROPOFOL;  Surgeon: Charolett Bumpers, MD;  Location: [facility] ENDOSCOPY;  Service: Endoscopy;  Laterality: N/A;  . ESOPHAGOGASTRODUODENOSCOPY (EGD) WITH PROPOFOL N/A 04/30/2014   Procedure: ESOPHAGOGASTRODUODENOSCOPY (EGD) WITH PROPOFOL;  Surgeon: Charolett Bumpers, MD;  Location: [facility] ENDOSCOPY;  Service: Endoscopy;  Laterality: N/A;  . GANGLION CYST EXCISION     left wrist  . REFRACTIVE SURGERY     left eye  . thumb surgery     right thumb arthritic nodule removed  . TOE SURGERY     bilateral feet  . TONSILLECTOMY     age 46  . TUBAL LIGATION    . ulnar nerve replacement     left arm     OB History   No obstetric history on file.     Family History  Problem Relation Age of Onset  . Allergies Mother   . Asthma Mother   . Allergies Father   . Heart disease Father   . Asthma Sister   . Pulmonary fibrosis Sister   . Clotting disorder Sister     Social History   Tobacco Use  . Smoking status: Never Smoker  . Smokeless tobacco: Never Used  Substance Use Topics  . Alcohol use: No  .  Drug use: No    Home Medications Prior to Admission medications   Medication Sig Start Date End Date Taking? Authorizing Provider  acetaminophen-codeine (TYLENOL #3) 300-30 MG tablet Take by mouth. As needed 03/01/16   [provider]  albuterol (PROVENTIL HFA;VENTOLIN HFA) 108 (90 Base) MCG/ACT inhaler Inhale 2 puffs into the lungs every 6 (six) hours as needed for wheezing or shortness of breath.    [provider]  albuterol (PROVENTIL) (2.5 MG/3ML) 0.083% nebulizer solution Take 3 mLs (2.5 mg total) by nebulization every 6 (six) hours as needed for wheezing or shortness of breath. 06/28/16   Oretha Milch, MD  albuterol (PROVENTIL) (2.5 MG/3ML)  0.083% nebulizer solution Take 3 mLs (2.5 mg total) by nebulization every 6 (six) hours as needed for wheezing or shortness of breath. 06/28/16   Oretha Milch, MD  ALPRAZolam Prudy Feeler) 0.5 MG tablet Take 0.5 mg by mouth 4 (four) times daily as needed for anxiety.    [provider]  Anakinra Reita Cliche) 100 MG/0.67ML SOSY Inject 100 mg into the skin daily.    [provider]  buPROPion (WELLBUTRIN) 100 MG tablet Take 100 mg by mouth 2 (two) times daily.    [provider]  cycloSPORINE (RESTASIS) 0.05 % ophthalmic emulsion Place 1 drop into both eyes 2 (two) times daily.    [provider]  diclofenac sodium (VOLTAREN) 1 % GEL Apply 2 g topically 4 (four) times daily. As needed    [provider]  hydroxychloroquine (PLAQUENIL) 200 MG tablet Take 200 mg by mouth 2 (two) times daily.    [provider]  omeprazole (PRILOSEC) 20 MG capsule Take 20 mg by mouth daily.    [provider]  sertraline (ZOLOFT) 100 MG tablet Take 100 mg by mouth daily. Take 2 tablets once a day    [provider]  SUMAtriptan (IMITREX) 100 MG tablet Take 100 mg by mouth every 2 (two) hours as needed for migraine or headache. May repeat in 2 hours if headache persists or recurs.    [provider]  SYMBICORT 160-4.5 MCG/ACT inhaler INHALE 2 PUFFS INTO THE LUNGS 2 (TWO) TIMES DAILY. 10/12/17   Oretha Milch, MD  topiramate (TOPAMAX) 100 MG tablet Take 100-200 mg by mouth 2 (two) times daily.    [provider]  traZODone (DESYREL) 100 MG tablet Take 100 mg by mouth at bedtime.    [provider]    Allergies    Penicillins  Review of Systems   Review of Systems  Constitutional: Negative for chills and fever.  HENT: Negative for sore throat.   Eyes: Negative for visual disturbance.  Respiratory: Negative for cough and shortness of breath.   Cardiovascular: Negative for chest pain.  Gastrointestinal: Positive for abdominal  pain, diarrhea and nausea. Negative for anorexia, constipation, hematemesis, hematochezia and vomiting.  Genitourinary: Negative for dysuria, hematuria, vaginal bleeding and vaginal discharge.  Musculoskeletal: Negative for neck pain.  Skin: Negative for rash.  Neurological: Negative for headaches.    Physical Exam Updated Vital Signs BP (!) 150/67 (BP Location: Right Arm)   Pulse 87   Temp 98 F (36.7 C) (Oral)   Resp 16   Ht 5\' 2"  (1.575 m)   Wt 82.6 kg   SpO2 100%   BMI 33.29 kg/m   Physical Exam Vitals and nursing note reviewed.  Constitutional:      General: She is not in acute distress.    Appearance: She is well-developed.  HENT:     Head: Normocephalic and atraumatic.  Eyes:     Conjunctiva/sclera: Conjunctivae normal.  Cardiovascular:     Rate and Rhythm: Normal rate and regular rhythm.     Heart sounds: No murmur heard.   Pulmonary:     Effort: Pulmonary effort is normal. No respiratory distress.     Breath sounds: Normal breath sounds.  Abdominal:     Palpations: Abdomen is soft.     Tenderness: There is no abdominal tenderness. There is no guarding or rebound.     Hernia: No hernia is present.  Musculoskeletal:        General: No deformity or signs of injury. Normal range of motion.     Cervical back: Neck supple.  Skin:    General: Skin is warm and dry.     Capillary Refill: Capillary refill takes less than 2 seconds.  Neurological:     General: No focal deficit present.     Mental Status: She is alert.     ED Results / Procedures / Treatments   Labs (all labs ordered are listed, but only abnormal results are displayed) Labs Reviewed  BASIC METABOLIC PANEL - Abnormal; Notable for the following components:      Result Value   CO2 19 (*)    Glucose, Bld 105 (*)    BUN 22 (*)    All other components within normal limits  CBC WITH DIFFERENTIAL/PLATELET - Abnormal; Notable for the following components:   RBC 3.77 (*)    Hemoglobin 10.9 (*)     HCT 35.0 (*)    All other components within normal limits  URINALYSIS, ROUTINE W REFLEX MICROSCOPIC - Abnormal; Notable for the following components:   Leukocytes,Ua SMALL (*)    All other components within normal limits  URINALYSIS, MICROSCOPIC (REFLEX) - Abnormal; Notable for the following components:   Bacteria, UA FEW (*)    All other components within normal limits  RESPIRATORY PANEL BY RT PCR (FLU A&B, COVID)    EKG None  Radiology No results found.  Procedures Procedures (including critical care time)  Medications Ordered in ED Medications  iohexol (OMNIPAQUE) 300 MG/ML solution 100 mL (100 mLs Intravenous Contrast Given 06/24/20 1221)    ED Course  I have reviewed the triage vital signs and the nursing notes.  Pertinent labs & imaging results that were available during my care of the patient were reviewed by me and considered in my medical decision making (see chart for details).  Clinical Course as of Jun 24 1709  Tue Jun 24, 2020  1610 CT abdomen and pelvis 10/15 at [facility] - IMPRESSION:  1. Findings suggesting early acute appendicitis without evidence of perforation or abscess.  2. Diverticulosis of the sigmoid colon.  3. Nonobstructive stones of the left kidney with bilateral renal cysts.     [MB]  1003 Discussed with Dr. Sheliah Hatch general surgery.  He recommends the patient be transferred ED to ED at [facility] where they can evaluate her and make a decision on disposition.   [MB]  1011 Discussed with Dr. Gustavus Messing at [facility] long ED who accepts the patient for transfer.  Patient wishes to travel by private vehicle.  Instructed to stay n.p.o. and go directly there.   [MB]  1017 Patient to go by private vehicle.  Will secure IV.  She understands she needs to proceed to [facility] directly and not eat.   [MB]    Clinical Course User Index [MB] Terrilee Files,  MD   MDM Rules/Calculators/A&P                         This patient complains of right lower  quadrant pain and diarrhea; this involves an extensive number of treatment Options and is a complaint that carries with it a high risk of complications and Morbidity. The differential includes appendicitis, gastroenteritis, inflammatory bowel, irritable bowel, constipation  I ordered, reviewed and interpreted labs, which included CBC with normal white count normal hemoglobin, chemistries with low bicarb although has been seen on prior, urinalysis without obvious signs of infection, Covid testing negative Previous records obtained and reviewed in epic, reviewed [facility] visit and CT results I consulted Dr. Sheliah Hatch general surgery and discussed lab and imaging findings  Critical Interventions: None  After the interventions stated above, I reevaluated the patient and found patient abdomen still to be soft without any peritoneal signs.  She is comfortable with transfer to [facility] long for further surgical evaluation.  She is driving by private vehicle and her IV will be secured by nursing.   Final Clinical Impression(s) / ED Diagnoses Final diagnoses:  RLQ abdominal pain  Diarrhea, unspecified type    Rx / DC Orders ED Discharge Orders    None       Terrilee Files, MD 06/24/20 1712

## 2020-06-24 NOTE — Consult Note (Signed)
[facility] Surgery Consult Note  Lori [name] 1961-04-08  [id number].    Requesting [name]: Lori [name] Chief Complaint/Reason for Consult: appendicitis?  HPI:  Lori [name] is a 59yo female PMH rheumatoid arthritis, anxiety/depression, asthma, and GERD who was transferred from [name] to [facility] for evaluation by general surgery for possible appendicitis. Patient states that last Monday (8 days ago) she developed generalized abdominal pain, nausea, and diarrhea. The pain localized to RLQ and became more severe so she went to the ED Tuesday. The wait was too long so she left and followed up with PCP on Thursday who ordered CT scan. This reports early acute appendicitis without evidence of perforation or abscess. She was advised to go to the ED but did not want to wait again so her PCP called to make an outpatient appointment in our office. Dr. Dwain Sarna advised that she go to [facility]. There she underwent labwork that was fairly unremarkable with WBC 7.7, creatinine 0.90. Covid test is negative. Transferred to [name] for evaluation by surgery.  Since onset of symptoms her pain has actually improved. She continues to have some intermittent RLQ pain and nausea, but the pain is no longer severe. She has been tolerating a regular diet at home. Denies CP, SOB, fever, chills, dysuria.  She has had nothing to eat/drink today  Abdominal surgical history: tubal ligation Anticoagulants: none Nonsmoker  Review of Systems  Constitutional: Negative.   HENT: Negative.   Eyes: Negative.   Respiratory: Negative.   Gastrointestinal: Positive for abdominal pain, diarrhea and nausea. Negative for vomiting.  Genitourinary: Negative.   Musculoskeletal: Positive for joint pain.  Skin: Negative.    All systems reviewed and otherwise negative except for as above  Family History  Problem Relation Age of Onset  . Allergies Mother   . Asthma Mother   . Allergies Father   . Heart disease Father   . Asthma  Sister   . Pulmonary fibrosis Sister   . Clotting disorder Sister     Past Medical History:  Diagnosis Date  . Anxiety   . Arthritis    rheumatoid  . Asthma   . Depression   . Fibromyalgia   . GERD (gastroesophageal reflux disease)   . Headache(784.0)    migraines    Past Surgical History:  Procedure Laterality Date  . arthroscopic surgery     right knee  . COLONOSCOPY WITH PROPOFOL N/A 04/30/2014   Procedure: COLONOSCOPY WITH PROPOFOL;  Surgeon: Charolett Bumpers, [name];  Location: [facility];  Service: Endoscopy;  Laterality: N/A;  . ESOPHAGOGASTRODUODENOSCOPY (EGD) WITH PROPOFOL N/A 04/30/2014   Procedure: ESOPHAGOGASTRODUODENOSCOPY (EGD) WITH PROPOFOL;  Surgeon: Charolett Bumpers, [name];  Location: [facility];  Service: Endoscopy;  Laterality: N/A;  . GANGLION CYST EXCISION     left wrist  . REFRACTIVE SURGERY     left eye  . thumb surgery     right thumb arthritic nodule removed  . TOE SURGERY     bilateral feet  . TONSILLECTOMY     age 5  . TUBAL LIGATION    . ulnar nerve replacement     left arm    Social History:  reports that she has never smoked. She has never used smokeless tobacco. She reports that she does not drink alcohol and does not use drugs.  Allergies:  Allergies  Allergen Reactions  . Penicillins     REACTION: rash  . Oxycodone Rash    (Not in a [name] admission)  Prior to Admission medications   Medication Sig Start Date End Date Taking? Authorizing Provider  azelastine (ASTELIN) 0.1 % nasal spray Place 2 sprays into both nostrils 2 (two) times daily as needed for allergies.  06/14/20  Yes Provider, Historical, [name]  buPROPion (WELLBUTRIN) 100 MG tablet Take 100 mg by mouth 2 (two) times daily.   Yes Provider, Historical, [name]  diclofenac sodium (VOLTAREN) 1 % GEL Apply 2 g topically 4 (four) times daily as needed (pain). As needed   Yes Provider, Historical, [name]  hydroxychloroquine (PLAQUENIL) 200 MG tablet Take 200 mg by mouth 2 (two) times  daily.   Yes Provider, Historical, [name]  omeprazole (PRILOSEC) 20 MG capsule Take 20 mg by mouth daily.   Yes Provider, Historical, [name]  traZODone (DESYREL) 100 MG tablet Take 100 mg by mouth at bedtime.   Yes Provider, Historical, [name]  acetaminophen-codeine (TYLENOL #3) 300-30 MG tablet Take by mouth. As needed 03/01/16   Provider, Historical, [name]  albuterol (PROVENTIL HFA;VENTOLIN HFA) 108 (90 Base) MCG/ACT inhaler Inhale 2 puffs into the lungs every 6 (six) hours as needed for wheezing or shortness of breath.    Provider, Historical, [name]  albuterol (PROVENTIL) (2.5 MG/3ML) 0.083% nebulizer solution Take 3 mLs (2.5 mg total) by nebulization every 6 (six) hours as needed for wheezing or shortness of breath. 06/28/16   Oretha Milch, [name]  albuterol (PROVENTIL) (2.5 MG/3ML) 0.083% nebulizer solution Take 3 mLs (2.5 mg total) by nebulization every 6 (six) hours as needed for wheezing or shortness of breath. 06/28/16   Oretha Milch, [name]  ALPRAZolam Prudy Feeler) 0.5 MG tablet Take 0.5 mg by mouth 4 (four) times daily as needed for anxiety.    Provider, Historical, [name]  [place]akinra Reita Cliche) 100 MG/0.67ML SOSY Inject 100 mg into the skin daily.    Provider, Historical, [name]  ciprofloxacin (CIPRO) 500 MG tablet Take 500 mg by mouth 2 (two) times daily. 10 day supply 06/20/20   Provider, Historical, [name]  cycloSPORINE (RESTASIS) 0.05 % ophthalmic emulsion Place 1 drop into both eyes 2 (two) times daily.    Provider, Historical, [name]  metroNIDAZOLE (FLAGYL) 500 MG tablet Take 500 mg by mouth 3 (three) times daily. 10 day supply 06/20/20   Provider, Historical, [name]  sertraline (ZOLOFT) 100 MG tablet Take 100 mg by mouth daily. Take 2 tablets once a day    Provider, Historical, [name]  sulfaSALAzine (AZULFIDINE) 500 MG tablet Take 1,000 mg by mouth 2 (two) times daily. 05/09/20   Provider, Historical, [name]  SUMAtriptan (IMITREX) 100 MG tablet Take 100 mg by mouth every 2 (two) hours as needed for migraine or headache. May repeat in 2 hours  if headache persists or recurs.    Provider, Historical, [name]  SYMBICORT 160-4.5 MCG/ACT inhaler INHALE 2 PUFFS INTO THE LUNGS 2 (TWO) TIMES DAILY. 10/12/17   Oretha Milch, [name]  topiramate (TOPAMAX) 100 MG tablet Take 100-200 mg by mouth 2 (two) times daily.    Provider, Historical, [name]    Blood pressure (!) 152/69, pulse 72, temperature 98 F (36.7 C), temperature source Oral, resp. rate 18, height 5\' 2"  (1.575 m), weight 82.6 kg, SpO2 99 %. Physical Exam: General: pleasant, WD/WN white female who is laying in bed in NAD HEENT: head is normocephalic, atraumatic.  Sclera are noninjected.  PERRL.  Ears and nose without any masses or lesions.  Mouth is pink and moist. Dentition fair Heart: regular, rate, and rhythm.  Normal s1,s2. No obvious murmurs, gallops, or rubs noted.  Palpable pedal pulses bilaterally  Lungs: CTAB, no wheezes, rhonchi, or rales noted.  Respiratory effort nonlabored Abd: soft, minimal distension, +BS, no masses, hernias, or organomegaly. Mild TTP RLQ and suprapubic region without rebound or guarding MS: no BUE/BLE edema, calves soft and nontender Skin: warm and dry with no masses, lesions, or rashes Psych: A&Ox4 with an appropriate affect Neuro: cranial nerves grossly intact, equal strength in BUE/BLE bilaterally, normal speech, thought process intact  Results for orders placed or performed during the [name] encounter of 06/24/20 (from the past 48 hour(s))  Basic metabolic panel     Status: Abnormal   Collection Time: 06/24/20  8:19 AM  Result Value Ref Range   Sodium 136 135 - 145 mmol/L   Potassium 4.4 3.5 - 5.1 mmol/L   Chloride 107 98 - 111 mmol/L   CO2 19 (L) 22 - 32 mmol/L   Glucose, Bld 105 (H) 70 - 99 mg/dL    Comment: Glucose reference range applies only to samples taken after fasting for at least 8 hours.   BUN 22 (H) 6 - 20 mg/dL   Creatinine, Ser 9.73 0.44 - 1.00 mg/dL   Calcium 9.0 8.9 - 53.2 mg/dL   GFR, Estimated >99 >24 mL/min   Anion gap 10 5 - 15     Comment: Performed at [facility], [address] [place], [place] [address]  CBC with Differential     Status: Abnormal   Collection Time: 06/24/20  8:19 AM  Result Value Ref Range   WBC 7.7 4.0 - 10.5 K/uL   RBC 3.77 (L) 3.87 - 5.11 MIL/uL   Hemoglobin 10.9 (L) 12.0 - 15.0 g/dL   HCT 19.6 (L) 36 - 46 %   MCV 92.8 80.0 - 100.0 fL   MCH 28.9 26.0 - 34.0 pg   MCHC 31.1 30.0 - 36.0 g/dL   RDW 22.2 97.9 - 89.2 %   Platelets 380 150 - 400 K/uL   nRBC 0.0 0.0 - 0.2 %   Neutrophils Relative % 72 %   Neutro Abs 5.5 1.7 - 7.7 K/uL   Lymphocytes Relative 15 %   Lymphs Abs 1.2 0.7 - 4.0 K/uL   Monocytes Relative 9 %   Monocytes Absolute 0.7 0.1 - 1.0 K/uL   Eosinophils Relative 3 %   Eosinophils Absolute 0.3 0.0 - 0.5 K/uL   Basophils Relative 1 %   Basophils Absolute 0.1 0.0 - 0.1 K/uL   Immature Granulocytes 0 %   Abs Immature Granulocytes 0.03 0.00 - 0.07 K/uL    Comment: Performed at [facility], [address] [place], [place] [address]  Urinalysis, Routine w reflex microscopic Urine, Clean Catch     Status: Abnormal   Collection Time: 06/24/20  8:19 AM  Result Value Ref Range   Color, Urine YELLOW YELLOW   APPearance CLEAR CLEAR   Specific Gravity, Urine 1.020 1.005 - 1.030   pH 5.5 5.0 - 8.0   Glucose, UA NEGATIVE NEGATIVE mg/dL   Hgb urine dipstick NEGATIVE NEGATIVE   Bilirubin Urine NEGATIVE NEGATIVE   Ketones, ur NEGATIVE NEGATIVE mg/dL   Protein, ur NEGATIVE NEGATIVE mg/dL   Nitrite NEGATIVE NEGATIVE   Leukocytes,Ua SMALL (A) NEGATIVE    Comment: Performed at [name], [address] [name] [address] [place], [place] [address]  Urinalysis, Microscopic (reflex)     Status: Abnormal   Collection Time: 06/24/20  8:19 AM  Result Value Ref Range   RBC /  HPF NONE SEEN 0 - 5 RBC/hpf   WBC, UA 0-5 0 - 5 WBC/hpf   Bacteria, UA FEW (A) NONE SEEN   Squamous Epithelial / LPF 0-5 0 - 5    Comment: Performed at [facility], [address] [place] [address] [place], [place] [address]  Respiratory Panel by RT PCR (Flu A&B, Covid) - Nasopharyngeal Swab     Status: None   Collection Time: 06/24/20 10:21 AM   Specimen: Nasopharyngeal Swab  Result Value Ref Range   SARS Coronavirus 2 by RT PCR NEGATIVE NEGATIVE    Comment: (NOTE) SARS-CoV-2 target nucleic acids are NOT DETECTED.  The SARS-CoV-2 RNA is generally detectable in upper respiratoy specimens during the acute phase of infection. The lowest concentration of SARS-CoV-2 viral copies this assay can detect is 131 copies/mL. A negative result does not preclude SARS-Cov-2 infection and should not be used as the sole basis for treatment or other patient management decisions. A negative result may occur with  improper specimen collection/handling, submission of specimen other than nasopharyngeal swab, presence of viral mutation(s) within the areas targeted by this assay, and inadequate number of viral copies (<131 copies/mL). A negative result must be combined with clinical observations, patient history, and epidemiological information. The expected result is Negative.  Fact Sheet for Patients:  https://www.moore.com/  Fact Sheet for Healthcare Providers:  https://www.young.biz/  This test is no t yet approved or cleared by the [place] FDA and  has been authorized for detection and/or diagnosis of SARS-CoV-2 by FDA under an Emergency Use Authorization (EUA). This EUA will remain  in effect (meaning this test can be used) for the duration of the COVID-19 declaration under Section 564(b)(1) of the Act, 21 U.S.C. section 360bbb-3(b)(1), unless the authorization is terminated or revoked sooner.     Influenza A by PCR NEGATIVE NEGATIVE   Influenza B by PCR NEGATIVE NEGATIVE    Comment: (NOTE) The Xpert Xpress SARS-CoV-2/FLU/RSV assay is intended as an aid in  the diagnosis of influenza from Nasopharyngeal swab specimens and  should not be used  as a sole basis for treatment. Nasal washings and  aspirates are unacceptable for Xpert Xpress SARS-CoV-2/FLU/RSV  testing.  Fact Sheet for Patients: https://www.moore.com/  Fact Sheet for Healthcare Providers: https://www.young.biz/  This test is not yet approved or cleared by the [place] FDA and  has been authorized for detection and/or diagnosis of SARS-CoV-2 by  FDA under an Emergency Use Authorization (EUA). This EUA will remain  in effect (meaning this test can be used) for the duration of the  Covid-19 declaration under Section 564(b)(1) of the Act, 21  U.S.C. section 360bbb-3(b)(1), unless the authorization is  terminated or revoked. Performed at [name], [address] [place], [place] [address]    No results found.    Assessment/Plan Rheumatoid arthritis - followed by Dr. Herma Carson rheumatologist in [facility], currently managed on plaquenil and sulfasalazine  Chronic anemia Anxiety/depression Asthma GERD  RLQ abdominal pain, nausea - Patient with 8 days of RLQ abdominal pain, nausea, and diarrhea that have since improved but not completely resolved. She was initially sent to ED for evaluation of appendicitis seen on CT scan from outside facility on 06/19/20. Due to length of symptoms a CT scan was repeated today to rule out possibility of perforated appendicitis. This scan shows normal appendix with air in mid body. No leukocytosis. Discussed findings with patient. Recommend oral intake trial and plan to discharge home  [facility],  M.D. [facility] Office [phone]

## 2020-06-24 NOTE — Discharge Instructions (Signed)

## 2020-06-24 NOTE — ED Provider Notes (Addendum)
Care of the patient assumed on arrival to [facility] from [facility] with outpatient CT showing early appendicitis on 10/15. Worsening pain  Physical Exam  BP (!) 152/69 (BP Location: Left Arm)   Pulse 72   Temp 98 F (36.7 C) (Oral)   Resp 18   Ht 5\' 2"  (1.575 m)   Wt 82.6 kg   SpO2 99%   BMI 33.29 kg/m   Physical Exam Comfortable RLQ abdominal pain  ED Course/Procedures   Clinical Course as of Jun 25 1139  Tue Jun 24, 2020  0750 CT abdomen and pelvis 10/15 at [facility] - IMPRESSION:  1. Findings suggesting early acute appendicitis without evidence of perforation or abscess.  2. Diverticulosis of the sigmoid colon.  3. Nonobstructive stones of the left kidney with bilateral renal cysts.     [MB]  1003 Discussed with Dr. 11/15 general surgery.  He recommends the patient be transferred ED to ED at [facility] where they can evaluate her and make a decision on disposition.   [MB]  1011 Discussed with Dr. [facility] at [facility] ED who accepts the patient for transfer.  Patient wishes to travel by private vehicle.  Instructed to stay n.p.o. and go directly there.   [MB]  1017 Patient to go by private vehicle.  Will secure IV.  She understands she needs to proceed to [facility] directly and not eat.   [MB]    Clinical Course User Index [MB] [place], MD    Procedures  MDM  Surgery paged to let them know she is here.   1:39 PM Surgery has seen patient, repeated CT and it is negative for appendicitis. They will plan to discharge patient home.    Terrilee Files, MD 06/24/20 1340

## 2020-07-10 DIAGNOSIS — Z79899 Other long term (current) drug therapy: Secondary | ICD-10-CM | POA: Diagnosis not present

## 2020-07-10 DIAGNOSIS — M2550 Pain in unspecified joint: Secondary | ICD-10-CM | POA: Diagnosis not present

## 2020-07-10 DIAGNOSIS — M0579 Rheumatoid arthritis with rheumatoid factor of multiple sites without organ or systems involvement: Secondary | ICD-10-CM | POA: Diagnosis not present

## 2020-08-11 DIAGNOSIS — M0579 Rheumatoid arthritis with rheumatoid factor of multiple sites without organ or systems involvement: Secondary | ICD-10-CM | POA: Diagnosis not present

## 2020-08-25 DIAGNOSIS — M0579 Rheumatoid arthritis with rheumatoid factor of multiple sites without organ or systems involvement: Secondary | ICD-10-CM | POA: Diagnosis not present

## 2020-09-11 DIAGNOSIS — Z20822 Contact with and (suspected) exposure to covid-19: Secondary | ICD-10-CM | POA: Diagnosis not present

## 2020-09-11 DIAGNOSIS — Z03818 Encounter for observation for suspected exposure to other biological agents ruled out: Secondary | ICD-10-CM | POA: Diagnosis not present

## 2020-10-23 DIAGNOSIS — Z79899 Other long term (current) drug therapy: Secondary | ICD-10-CM | POA: Diagnosis not present

## 2020-10-23 DIAGNOSIS — Z23 Encounter for immunization: Secondary | ICD-10-CM | POA: Diagnosis not present

## 2020-10-23 DIAGNOSIS — M2550 Pain in unspecified joint: Secondary | ICD-10-CM | POA: Diagnosis not present

## 2020-10-23 DIAGNOSIS — M0579 Rheumatoid arthritis with rheumatoid factor of multiple sites without organ or systems involvement: Secondary | ICD-10-CM | POA: Diagnosis not present

## 2020-11-06 DIAGNOSIS — B373 Candidiasis of vulva and vagina: Secondary | ICD-10-CM | POA: Diagnosis not present

## 2020-12-01 ENCOUNTER — Other Ambulatory Visit: Payer: Self-pay | Admitting: Family Medicine

## 2020-12-01 ENCOUNTER — Other Ambulatory Visit (HOSPITAL_COMMUNITY)
Admission: RE | Admit: 2020-12-01 | Discharge: 2020-12-01 | Disposition: A | Discharge status: Home / Self Care | Payer: Federal, State, Local not specified - PPO | Source: Ambulatory Visit | Attending: Family Medicine | Admitting: Family Medicine

## 2020-12-01 DIAGNOSIS — Z5181 Encounter for therapeutic drug level monitoring: Secondary | ICD-10-CM | POA: Diagnosis not present

## 2020-12-01 DIAGNOSIS — Z01411 Encounter for gynecological examination (general) (routine) with abnormal findings: Secondary | ICD-10-CM | POA: Diagnosis not present

## 2020-12-01 DIAGNOSIS — E559 Vitamin D deficiency, unspecified: Secondary | ICD-10-CM | POA: Diagnosis not present

## 2020-12-01 DIAGNOSIS — E785 Hyperlipidemia, unspecified: Secondary | ICD-10-CM | POA: Diagnosis not present

## 2020-12-01 DIAGNOSIS — Z124 Encounter for screening for malignant neoplasm of cervix: Secondary | ICD-10-CM | POA: Diagnosis not present

## 2020-12-01 DIAGNOSIS — Z0000 Encounter for general adult medical examination without abnormal findings: Secondary | ICD-10-CM | POA: Diagnosis not present

## 2020-12-03 LAB — CYTOLOGY - PAP
Comment: NEGATIVE
Diagnosis: UNDETERMINED — AB
High risk HPV: NEGATIVE

## 2021-01-29 DIAGNOSIS — M2550 Pain in unspecified joint: Secondary | ICD-10-CM | POA: Diagnosis not present

## 2021-01-29 DIAGNOSIS — M0579 Rheumatoid arthritis with rheumatoid factor of multiple sites without organ or systems involvement: Secondary | ICD-10-CM | POA: Diagnosis not present

## 2021-02-23 DIAGNOSIS — M0579 Rheumatoid arthritis with rheumatoid factor of multiple sites without organ or systems involvement: Secondary | ICD-10-CM | POA: Diagnosis not present

## 2021-03-10 DIAGNOSIS — M0579 Rheumatoid arthritis with rheumatoid factor of multiple sites without organ or systems involvement: Secondary | ICD-10-CM | POA: Diagnosis not present

## 2021-05-04 ENCOUNTER — Other Ambulatory Visit: Payer: Self-pay

## 2021-05-04 ENCOUNTER — Encounter (HOSPITAL_COMMUNITY): Payer: Self-pay

## 2021-05-04 ENCOUNTER — Emergency Department (HOSPITAL_COMMUNITY)
Admission: EM | Admit: 2021-05-04 | Discharge: 2021-05-04 | Disposition: A | Discharge status: Home / Self Care | Payer: Federal, State, Local not specified - PPO | Attending: Emergency Medicine | Admitting: Emergency Medicine

## 2021-05-04 DIAGNOSIS — S91352A Open bite, left foot, initial encounter: Secondary | ICD-10-CM | POA: Insufficient documentation

## 2021-05-04 DIAGNOSIS — W5911XA Bitten by nonvenomous snake, initial encounter: Secondary | ICD-10-CM | POA: Insufficient documentation

## 2021-05-04 DIAGNOSIS — S99922A Unspecified injury of left foot, initial encounter: Secondary | ICD-10-CM | POA: Diagnosis not present

## 2021-05-04 DIAGNOSIS — R609 Edema, unspecified: Secondary | ICD-10-CM | POA: Diagnosis not present

## 2021-05-04 DIAGNOSIS — J45909 Unspecified asthma, uncomplicated: Secondary | ICD-10-CM | POA: Insufficient documentation

## 2021-05-04 DIAGNOSIS — R791 Abnormal coagulation profile: Secondary | ICD-10-CM | POA: Diagnosis not present

## 2021-05-04 DIAGNOSIS — T63001A Toxic effect of unspecified snake venom, accidental (unintentional), initial encounter: Secondary | ICD-10-CM | POA: Diagnosis not present

## 2021-05-04 DIAGNOSIS — W5581XA Bitten by other mammals, initial encounter: Secondary | ICD-10-CM | POA: Diagnosis not present

## 2021-05-04 DIAGNOSIS — I10 Essential (primary) hypertension: Secondary | ICD-10-CM | POA: Diagnosis not present

## 2021-05-04 LAB — BASIC METABOLIC PANEL
Anion gap: 7 (ref 5–15)
BUN: 16 mg/dL (ref 8–23)
CO2: 26 mmol/L (ref 22–32)
Calcium: 9.2 mg/dL (ref 8.9–10.3)
Chloride: 106 mmol/L (ref 98–111)
Creatinine, Ser: 0.62 mg/dL (ref 0.44–1.00)
GFR, Estimated: >60 mL/min (ref >60)
Glucose, Bld: 115 mg/dL — ABNORMAL HIGH (ref 70–99)
Potassium: 3.7 mmol/L (ref 3.5–5.1)
Sodium: 139 mmol/L (ref 135–145)

## 2021-05-04 LAB — CBC WITH DIFFERENTIAL/PLATELET
Abs Immature Granulocytes: 0.01 10*3/uL (ref 0.00–0.07)
Abs Immature Granulocytes: 0.04 10*3/uL (ref 0.00–0.07)
Abs Immature Granulocytes: 0.03 10*3/uL (ref 0.00–0.07)
Basophils Absolute: 0.1 10*3/uL (ref 0.0–0.1)
Basophils Absolute: 0.1 10*3/uL (ref 0.0–0.1)
Basophils Absolute: 0.1 10*3/uL (ref 0.0–0.1)
Basophils Relative: 1 %
Basophils Relative: 1 %
Basophils Relative: 1 %
Eosinophils Absolute: 0.2 10*3/uL (ref 0.0–0.5)
Eosinophils Absolute: 0 10*3/uL (ref 0.0–0.5)
Eosinophils Absolute: 0 10*3/uL (ref 0.0–0.5)
Eosinophils Relative: 3 %
Eosinophils Relative: 0 %
Eosinophils Relative: 0 %
HCT: 38 % (ref 36.0–46.0)
HCT: 35.7 % — ABNORMAL LOW (ref 36.0–46.0)
HCT: 38.9 % (ref 36.0–46.0)
Hemoglobin: 12 g/dL (ref 12.0–15.0)
Hemoglobin: 11.1 g/dL — ABNORMAL LOW (ref 12.0–15.0)
Hemoglobin: 12.1 g/dL (ref 12.0–15.0)
Immature Granulocytes: 0 %
Immature Granulocytes: 0 %
Immature Granulocytes: 0 %
Lymphocytes Relative: 23 %
Lymphocytes Relative: 7 %
Lymphocytes Relative: 8 %
Lymphs Abs: 1.2 10*3/uL (ref 0.7–4.0)
Lymphs Abs: 0.8 10*3/uL (ref 0.7–4.0)
Lymphs Abs: 0.7 10*3/uL (ref 0.7–4.0)
MCH: 28.8 pg (ref 26.0–34.0)
MCH: 28.9 pg (ref 26.0–34.0)
MCH: 28.7 pg (ref 26.0–34.0)
MCHC: 31.6 g/dL (ref 30.0–36.0)
MCHC: 31.1 g/dL (ref 30.0–36.0)
MCHC: 31.1 g/dL (ref 30.0–36.0)
MCV: 91.3 fL (ref 80.0–100.0)
MCV: 93 fL (ref 80.0–100.0)
MCV: 92.4 fL (ref 80.0–100.0)
Monocytes Absolute: 0.7 10*3/uL (ref 0.1–1.0)
Monocytes Absolute: 0.8 10*3/uL (ref 0.1–1.0)
Monocytes Absolute: 0.7 10*3/uL (ref 0.1–1.0)
Monocytes Relative: 13 %
Monocytes Relative: 8 %
Monocytes Relative: 8 %
Neutro Abs: 3.3 10*3/uL (ref 1.7–7.7)
Neutro Abs: 9.1 10*3/uL — ABNORMAL HIGH (ref 1.7–7.7)
Neutro Abs: 8 10*3/uL — ABNORMAL HIGH (ref 1.7–7.7)
Neutrophils Relative %: 60 %
Neutrophils Relative %: 84 %
Neutrophils Relative %: 83 %
Platelets: 273 10*3/uL (ref 150–400)
Platelets: 252 10*3/uL (ref 150–400)
Platelets: 296 10*3/uL (ref 150–400)
RBC: 4.16 MIL/uL (ref 3.87–5.11)
RBC: 3.84 MIL/uL — ABNORMAL LOW (ref 3.87–5.11)
RBC: 4.21 MIL/uL (ref 3.87–5.11)
RDW: 14.7 % (ref 11.5–15.5)
RDW: 14.8 % (ref 11.5–15.5)
RDW: 14.9 % (ref 11.5–15.5)
WBC: 5.4 10*3/uL (ref 4.0–10.5)
WBC: 10.8 10*3/uL — ABNORMAL HIGH (ref 4.0–10.5)
WBC: 9.5 10*3/uL (ref 4.0–10.5)
nRBC: 0 % (ref 0.0–0.2)
nRBC: 0 % (ref 0.0–0.2)
nRBC: 0 % (ref 0.0–0.2)

## 2021-05-04 LAB — FIBRINOGEN
Fibrinogen: 327 mg/dL (ref 210–475)
Fibrinogen: 298 mg/dL (ref 210–475)
Fibrinogen: 354 mg/dL (ref 210–475)

## 2021-05-04 LAB — PROTIME-INR
INR: 1 (ref 0.8–1.2)
INR: 1.1 (ref 0.8–1.2)
INR: 1.1 (ref 0.8–1.2)
Prothrombin Time: 13.4 seconds (ref 11.4–15.2)
Prothrombin Time: 14.3 seconds (ref 11.4–15.2)
Prothrombin Time: 14 seconds (ref 11.4–15.2)

## 2021-05-04 MED ORDER — OXYCODONE-ACETAMINOPHEN 5-325 MG PO TABS: 1.0000 | ORAL_TABLET | ORAL | 0 refills | Status: AC | PRN

## 2021-05-04 MED ORDER — ONDANSETRON HCL 4 MG/2ML IJ SOLN
4.0000 mg | Freq: Once | INTRAMUSCULAR | Status: AC
  Administered 2021-05-04: 4 mg via INTRAVENOUS
  Filled 2021-05-04: qty 2

## 2021-05-04 MED ORDER — HYDROMORPHONE HCL 1 MG/ML IJ SOLN
1.0000 mg | Freq: Once | INTRAMUSCULAR | Status: AC
  Administered 2021-05-04: 1 mg via INTRAVENOUS
  Filled 2021-05-04: qty 1

## 2021-05-04 MED ORDER — MORPHINE SULFATE (PF) 4 MG/ML IV SOLN
6.0000 mg | Freq: Once | INTRAVENOUS | Status: AC
  Administered 2021-05-04: 6 mg via INTRAVENOUS
  Filled 2021-05-04: qty 2

## 2021-05-04 MED ORDER — HYDROCODONE-ACETAMINOPHEN 5-325 MG PO TABS: 1.0000 | ORAL_TABLET | ORAL | 0 refills | Status: AC | PRN

## 2021-05-04 NOTE — ED Provider Notes (Signed)
[facility]-EMERGENCY DEPT Provider Note   CSN: [id number] Arrival date & time: 05/04/21  1000     History Chief Complaint  Patient presents with   Snake Bite    Lori [name] is a 60 y.o. female.  60 year old female presents with snakebite to left foot which occurred about an hour prior to arrival.  Patient states that she was stepping to her vehicle when she saw what appears to have been a copperhead bite her.  Called fire department she says that they were able to express them from the wound.  Complains of severe throbbing pain.  EMS called and patient given fentanyl which did help transiently.  Pain characterizes sharp and worse with any movement of her toes.  Denies any distal numbness or tingling.      Past Medical History:  Diagnosis Date   Anxiety    Arthritis    rheumatoid   Asthma    Depression    Fibromyalgia    GERD (gastroesophageal reflux disease)    Headache(784.0)    migraines    Patient Active Problem List   Diagnosis Date Noted   RHEUMATOID ARTHRITIS 06/09/2010   RHINITIS 07/11/2007   Asthma 07/11/2007    Past Surgical History:  Procedure Laterality Date   arthroscopic surgery     right knee   COLONOSCOPY WITH PROPOFOL N/A 04/30/2014   Procedure: COLONOSCOPY WITH PROPOFOL;  Surgeon: Charolett Bumpers, MD;  Location: [facility];  Service: Endoscopy;  Laterality: N/A;   ESOPHAGOGASTRODUODENOSCOPY (EGD) WITH PROPOFOL N/A 04/30/2014   Procedure: ESOPHAGOGASTRODUODENOSCOPY (EGD) WITH PROPOFOL;  Surgeon: Charolett Bumpers, MD;  Location: [facility];  Service: Endoscopy;  Laterality: N/A;   GANGLION CYST EXCISION     left wrist   REFRACTIVE SURGERY     left eye   thumb surgery     right thumb arthritic nodule removed   TOE SURGERY     bilateral feet   TONSILLECTOMY     age 3   TUBAL LIGATION     ulnar nerve replacement     left arm     OB History   No obstetric history on file.     Family History  Problem Relation  Age of Onset   Allergies Mother    Asthma Mother    Allergies Father    Heart disease Father    Asthma Sister    Pulmonary fibrosis Sister    Clotting disorder Sister     Social History   Tobacco Use   Smoking status: Never   Smokeless tobacco: Never  Substance Use Topics   Alcohol use: No   Drug use: No    Home Medications Prior to Admission medications   Medication Sig Start Date End Date Taking? Authorizing Provider  acetaminophen (TYLENOL) 325 MG tablet Take 650 mg by mouth every 6 (six) hours as needed for mild pain, fever or headache.    [provider]  albuterol (PROVENTIL) (2.5 MG/3ML) 0.083% nebulizer solution Take 3 mLs (2.5 mg total) by nebulization every 6 (six) hours as needed for wheezing or shortness of breath. Patient not taking: Reported on 06/24/2020 06/28/16   Oretha Milch, MD  albuterol (PROVENTIL) (2.5 MG/3ML) 0.083% nebulizer solution Take 3 mLs (2.5 mg total) by nebulization every 6 (six) hours as needed for wheezing or shortness of breath. Patient not taking: Reported on 06/24/2020 06/28/16   Oretha Milch, MD  azelastine (ASTELIN) 0.1 % nasal spray Place 2 sprays into both nostrils 2 (  two) times daily as needed for allergies.  06/14/20   [provider]  buPROPion (WELLBUTRIN) 100 MG tablet Take 100 mg by mouth 2 (two) times daily.    [provider]  ciprofloxacin (CIPRO) 500 MG tablet Take 500 mg by mouth 2 (two) times daily. 10 day supply 06/20/20   [provider]  cycloSPORINE (RESTASIS) 0.05 % ophthalmic emulsion Place 1 drop into both eyes 2 (two) times daily.    [provider]  diclofenac sodium (VOLTAREN) 1 % GEL Apply 2 g topically 4 (four) times daily as needed (pain). As needed    [provider]  guaiFENesin (ROBITUSSIN) 100 MG/5ML liquid Take 200 mg by mouth 3 (three) times daily as needed for cough.    [provider]  hydroxychloroquine (PLAQUENIL) 200 MG tablet Take 200 mg by  mouth 2 (two) times daily.    [provider]  metroNIDAZOLE (FLAGYL) 500 MG tablet Take 500 mg by mouth 3 (three) times daily. 10 day supply 06/20/20   [provider]  omeprazole (PRILOSEC) 20 MG capsule Take 20 mg by mouth daily.    [provider]  ondansetron (ZOFRAN) 4 MG tablet Take 1 tablet (4 mg total) by mouth every 6 (six) hours as needed for nausea or vomiting. 06/24/20   Meuth, Brooke A, PA-C  sertraline (ZOLOFT) 100 MG tablet Take 200 mg by mouth daily. Take 2 tablets once a day     [provider]  sulfaSALAzine (AZULFIDINE) 500 MG tablet Take 1,000 mg by mouth 2 (two) times daily. 05/09/20   [provider]  SYMBICORT 160-4.5 MCG/ACT inhaler INHALE 2 PUFFS INTO THE LUNGS 2 (TWO) TIMES DAILY. Patient not taking: Reported on 06/24/2020 10/12/17   Oretha Milch, MD  topiramate (TOPAMAX) 100 MG tablet Take 300 mg by mouth at bedtime.     [provider]  traZODone (DESYREL) 100 MG tablet Take 100 mg by mouth at bedtime.    [provider]    Allergies    Penicillins and Oxycodone  Review of Systems   Review of Systems  All other systems reviewed and are negative.  Physical Exam Updated Vital Signs BP (!) 145/88 (BP Location: Left Arm)   Pulse 72   Temp (!) 97.4 F (36.3 C) (Oral)   Resp 18   Ht 1.575 m (5\' 2" )   Wt 89.8 kg   SpO2 95%   BMI 36.21 kg/m   Physical Exam Vitals and nursing note reviewed.  Constitutional:      General: She is not in acute distress.    Appearance: Normal appearance. She is well-developed. She is not toxic-appearing.  HENT:     Head: Normocephalic and atraumatic.  Eyes:     General: Lids are normal.     Conjunctiva/sclera: Conjunctivae normal.     Pupils: Pupils are equal, round, and reactive to light.  Neck:     Thyroid: No thyroid mass.     Trachea: No tracheal deviation.  Cardiovascular:     Rate and Rhythm: Normal rate and regular rhythm.     Heart sounds: Normal heart  sounds. No murmur heard.   No gallop.  Pulmonary:     Effort: Pulmonary effort is normal. No respiratory distress.     Breath sounds: Normal breath sounds. No stridor. No decreased breath sounds, wheezing, rhonchi or rales.  Abdominal:     General: There is no distension.     Palpations: Abdomen is soft.  Tenderness: There is no abdominal tenderness. There is no rebound.  Musculoskeletal:        General: No tenderness. Normal range of motion.     Cervical back: Normal range of motion and neck supple.       Legs:  Skin:    General: Skin is warm and dry.     Findings: No abrasion or rash.  Neurological:     Mental Status: She is alert and oriented to person, place, and time. Mental status is at baseline.     GCS: GCS eye subscore is 4. GCS verbal subscore is 5. GCS motor subscore is 6.     Cranial Nerves: Cranial nerves are intact. No cranial nerve deficit.     Sensory: No sensory deficit.     Motor: Motor function is intact.  Psychiatric:        Attention and Perception: Attention normal.        Speech: Speech normal.        Behavior: Behavior normal.    ED Results / Procedures / Treatments   Labs (all labs ordered are listed, but only abnormal results are displayed) Labs Reviewed  BASIC METABOLIC PANEL  CBC WITH DIFFERENTIAL/PLATELET  CBC WITH DIFFERENTIAL/PLATELET  CBC WITH DIFFERENTIAL/PLATELET  PROTIME-INR  PROTIME-INR  PROTIME-INR  FIBRINOGEN  FIBRINOGEN  FIBRINOGEN    EKG None  Radiology No results found.  Procedures Procedures   Medications Ordered in ED Medications  morphine 4 MG/ML injection 6 mg (has no administration in time range)    ED Course  I have reviewed the triage vital signs and the nursing notes.  Pertinent labs & imaging results that were available during my care of the patient were reviewed by me and considered in my medical decision making (see chart for details).    MDM Rules/Calculators/A&P                            Patient's wound marked on arrival here.  She was monitored for approximately 6 hours and had multiple rechecks.  Wound never extended more and passed her ankle as she was bitten on the foot.  Was given pain relief with Dilaudid and morphine.  Labs were reassuring here.  Plan will be for repeat blood work and if negative will go home.  Final Clinical Impression(s) / ED Diagnoses Final diagnoses:  None    Rx / DC Orders ED Discharge Orders     None        Lorre Nick, MD 05/04/21 1656

## 2021-05-04 NOTE — ED Notes (Signed)
Swelling appears to have slowed in progression, marked swelling and purple discoloration to L foot and ankle. Calves measures 15.75 inches bilaterally.

## 2021-05-04 NOTE — Discharge Instructions (Addendum)
Your history, exam and work-up today are consistent with snakebite.  Your blood was checked several times and did not have concerning evolution.  We feel you are safe for discharge home after 10 hours of observation.  Please use the pain medicine and follow-up.  Please confirm with your PCP that your tetanus is up-to-date.  If any symptoms change or worsen rapidly, please return to the nearest emergency department.

## 2021-05-04 NOTE — ED Provider Notes (Signed)
4:59 PM Care assumed Dr. Freida Busman.  At time of transfer care, patient is waiting for repeat laboratory testing to look for coagulopathic or abnormal findings after snakebite this morning.  If lab work does not worsen, plan of care be to discharge home for outpatient follow-up.  8:17 PM Patient's repeat labs were checked 2 times and revealed stability.  Patient reports her symptoms have not been worsening after several hours of observation.  Patient thinks her tetanus is up-to-date and will check with her PCP as opposed to getting a dose now.  Patient had pain medicine prescription sent to her pharmacy which she will pick up tomorrow.  She understands return precautions and follow-up instructions and was discharged in good condition.  Clinical Impression: 1. Snake bite, initial encounter     Disposition: Discharge  Condition: Good  I have discussed the results, Dx and Tx plan with the pt(& family if present). He/she/they expressed understanding and agree(s) with the plan. Discharge instructions discussed at great length. Strict return precautions discussed and pt &/or family have verbalized understanding of the instructions. No further questions at time of discharge.    Discharge Medication List as of 05/04/2021  8:08 PM     START taking these medications   Details  HYDROcodone-acetaminophen (NORCO/VICODIN) 5-325 MG tablet Take 1 tablet by mouth every 4 (four) hours as needed., Starting Mon 05/04/2021, Normal    oxyCODONE-acetaminophen (PERCOCET/ROXICET) 5-325 MG tablet Take 1 tablet by mouth every 4 (four) hours as needed for severe pain., Starting Mon 05/04/2021, Normal        Follow Up: Shirlean Mylar, MD [address] [place] [address] [phone]     [facility]ITY [facility] DEPT [address] [id number] mc [place] [address] [phone]       Audry Kauzlarich, Canary Brim, MD 05/04/21 2115

## 2021-05-04 NOTE — ED Triage Notes (Signed)
Pt BIB [facility]. Pt states that she was walking outside when she was bit on the left foot by a copperhead snake. Some swelling to the left foot was noted by EMS on arrival. A 20g and 100 mcg was given by EMS.

## 2021-05-12 DIAGNOSIS — S91352D Open bite, left foot, subsequent encounter: Secondary | ICD-10-CM | POA: Diagnosis not present

## 2021-05-12 DIAGNOSIS — W5911XD Bitten by nonvenomous snake, subsequent encounter: Secondary | ICD-10-CM | POA: Diagnosis not present

## 2021-05-12 DIAGNOSIS — Z23 Encounter for immunization: Secondary | ICD-10-CM | POA: Diagnosis not present

## 2021-06-09 DIAGNOSIS — M0579 Rheumatoid arthritis with rheumatoid factor of multiple sites without organ or systems involvement: Secondary | ICD-10-CM | POA: Diagnosis not present

## 2021-06-09 DIAGNOSIS — M2550 Pain in unspecified joint: Secondary | ICD-10-CM | POA: Diagnosis not present

## 2021-06-09 DIAGNOSIS — Z79899 Other long term (current) drug therapy: Secondary | ICD-10-CM | POA: Diagnosis not present

## 2021-06-15 ENCOUNTER — Ambulatory Visit (INDEPENDENT_AMBULATORY_CARE_PROVIDER_SITE_OTHER): Payer: Federal, State, Local not specified - PPO | Admitting: Orthopedic Surgery

## 2021-06-15 DIAGNOSIS — M79671 Pain in right foot: Secondary | ICD-10-CM | POA: Diagnosis not present

## 2021-06-15 DIAGNOSIS — M7661 Achilles tendinitis, right leg: Secondary | ICD-10-CM | POA: Diagnosis not present

## 2021-07-01 ENCOUNTER — Encounter: Payer: Self-pay | Admitting: Orthopedic Surgery

## 2021-07-01 DIAGNOSIS — M7661 Achilles tendinitis, right leg: Secondary | ICD-10-CM

## 2021-07-01 MED ORDER — LIDOCAINE HCL 1 % IJ SOLN
2.0000 mL | INTRAMUSCULAR | Status: AC | PRN
  Administered 2021-07-01: 2 mL

## 2021-07-01 MED ORDER — METHYLPREDNISOLONE ACETATE 40 MG/ML IJ SUSP
40.0000 mg | INTRAMUSCULAR | Status: AC | PRN
  Administered 2021-07-01: 40 mg via INTRA_ARTICULAR

## 2021-07-01 NOTE — Progress Notes (Signed)
Office Visit Note   Patient: Lori [name]           Date of Birth: Aug 16, 1961           MRN: [id number] Visit Date: 06/15/2021              Requested by: Shirlean Mylar, MD [address] [place],  [place] [address] PCP: Shirlean Mylar, MD (Inactive)  Chief Complaint  Patient presents with   Right Heel - Pain      HPI: Patient is a 60 year old woman who is seen for evaluation for right insertional Achilles tendinitis.  Patient was treated 78 years ago for a Haglund's deformity.  Patient states that she had an injection years ago that has worked until 3 to 4 months ago.  Patient has pain after increased activities.  Assessment & Plan: Visit Diagnoses:  1. Achilles tendinitis, right leg     Plan: The retro-Achilles area was injected she tolerated this well she was given instructions for Achilles stretching.  Follow-Up Instructions: Return in about 4 weeks (around 07/13/2021).   Ortho Exam  Patient is alert, oriented, no adenopathy, well-dressed, normal affect, normal respiratory effort. Examination patient has good pulses she has dorsiflexion to neutral.  She is tender to palpation in the retro-Achilles area of the right heel there is no palpable Achilles defect.  There is no pain along the posterior aspect of the Achilles.  Symptoms are consistent with a retro-Achilles bursitis secondary to the Haglund's deformity.  Imaging: No results found. No images are attached to the encounter.  Labs: No results found for: HGBA1C, ESRSEDRATE, CRP, LABURIC, REPTSTATUS, GRAMSTAIN, CULT, LABORGA   Lab Results  Component Value Date   ALBUMIN 4.0 06/17/2020    No results found for: MG No results found for: VD25OH  No results found for: PREALBUMIN CBC EXTENDED Latest Ref Rng & Units 05/04/2021 05/04/2021 05/04/2021  WBC 4.0 - 10.5 K/uL 9.5 10.8(H) 5.4  RBC 3.87 - 5.11 MIL/uL 4.21 3.84(L) 4.16  HGB 12.0 - 15.0 g/dL 60.1 11.1(L) 12.0  HCT 36.0 - 46.0 % 38.9 35.7(L) 38.0   PLT 150 - 400 K/uL 296 252 273  NEUTROABS 1.7 - 7.7 K/uL 8.0(H) 9.1(H) 3.3  LYMPHSABS 0.7 - 4.0 K/uL 0.7 0.8 1.2     There is no height or weight on file to calculate BMI.  Orders:  No orders of the defined types were placed in this encounter.  No orders of the defined types were placed in this encounter.    Procedures: Medium Joint Inj: R ankle on 07/01/2021 5:09 PM Indications: pain and diagnostic evaluation Details: 22 G 1.5 in needle, posterior approach Medications: 2 mL lidocaine 1 %; 40 mg methylPREDNISolone acetate 40 MG/ML Outcome: tolerated well, no immediate complications Procedure, treatment alternatives, risks and benefits explained, specific risks discussed. Consent was given by the patient. Immediately prior to procedure a time out was called to verify the correct patient, procedure, equipment, support staff and site/side marked as required. Patient was prepped and draped in the usual sterile fashion.     Clinical Data: No additional findings.  ROS:  All other systems negative, except as noted in the HPI. Review of Systems  Objective: Vital Signs: There were no vitals taken for this visit.  Specialty Comments:  No specialty comments available.  PMFS History: Patient Active Problem List   Diagnosis Date Noted   RHEUMATOID ARTHRITIS 06/09/2010   RHINITIS 07/11/2007   Asthma 07/11/2007   Past Medical History:  Diagnosis Date   Anxiety    Arthritis    rheumatoid   Asthma    Depression    Fibromyalgia    GERD (gastroesophageal reflux disease)    Headache(784.0)    migraines    Family History  Problem Relation Age of Onset   Allergies Mother    Asthma Mother    Allergies Father    Heart disease Father    Asthma Sister    Pulmonary fibrosis Sister    Clotting disorder Sister     Past Surgical History:  Procedure Laterality Date   arthroscopic surgery     right knee   COLONOSCOPY WITH PROPOFOL N/A 04/30/2014   Procedure: COLONOSCOPY WITH  PROPOFOL;  Surgeon: Charolett Bumpers, MD;  Location: [facility] END[facility]COPY;  Service: Endoscopy;  Laterality: N/A;   ESOPHAGOGASTRODUODENOSCOPY (EGD) WITH PROPOFOL N/A 04/30/2014   Procedure: ESOPHAGOGASTRODUODENOSCOPY (EGD) WITH PROPOFOL;  Surgeon: Charolett Bumpers, MD;  Location: [facility] ENDOSCOPY;  Service: Endoscopy;  Laterality: N/A;   GANGLION CYST EXCISION     left wrist   REFRACTIVE SURGERY     left eye   thumb surgery     right thumb arthritic nodule removed   TOE SURGERY     bilateral feet   TONSILLECTOMY     age 89   TUBAL LIGATION     ulnar nerve replacement     left arm   Social History   Occupational History   Occupation: Retired    Comment: Disablity  Tobacco Use   Smoking status: Never   Smokeless tobacco: Never  Substance and Sexual Activity   Alcohol use: No   Drug use: No   Sexual activity: Not on file

## 2021-07-14 DIAGNOSIS — H2513 Age-related nuclear cataract, bilateral: Secondary | ICD-10-CM | POA: Diagnosis not present

## 2021-07-14 DIAGNOSIS — Z79899 Other long term (current) drug therapy: Secondary | ICD-10-CM | POA: Diagnosis not present

## 2021-07-14 DIAGNOSIS — M0569 Rheumatoid arthritis of multiple sites with involvement of other organs and systems: Secondary | ICD-10-CM | POA: Diagnosis not present

## 2021-07-14 DIAGNOSIS — H16223 Keratoconjunctivitis sicca, not specified as Sjogren's, bilateral: Secondary | ICD-10-CM | POA: Diagnosis not present

## 2021-09-10 DIAGNOSIS — M0579 Rheumatoid arthritis with rheumatoid factor of multiple sites without organ or systems involvement: Secondary | ICD-10-CM | POA: Diagnosis not present

## 2021-09-24 DIAGNOSIS — M0579 Rheumatoid arthritis with rheumatoid factor of multiple sites without organ or systems involvement: Secondary | ICD-10-CM | POA: Diagnosis not present

## 2021-12-08 DIAGNOSIS — Z79899 Other long term (current) drug therapy: Secondary | ICD-10-CM | POA: Diagnosis not present

## 2021-12-08 DIAGNOSIS — M0579 Rheumatoid arthritis with rheumatoid factor of multiple sites without organ or systems involvement: Secondary | ICD-10-CM | POA: Diagnosis not present

## 2022-01-05 ENCOUNTER — Other Ambulatory Visit (HOSPITAL_COMMUNITY)
Admission: RE | Admit: 2022-01-05 | Discharge: 2022-01-05 | Disposition: A | Discharge status: Home / Self Care | Payer: Federal, State, Local not specified - PPO | Source: Ambulatory Visit | Attending: Family Medicine | Admitting: Family Medicine

## 2022-01-05 ENCOUNTER — Other Ambulatory Visit: Payer: Self-pay | Admitting: Family Medicine

## 2022-01-05 DIAGNOSIS — Z0000 Encounter for general adult medical examination without abnormal findings: Secondary | ICD-10-CM | POA: Diagnosis not present

## 2022-01-05 DIAGNOSIS — R030 Elevated blood-pressure reading, without diagnosis of hypertension: Secondary | ICD-10-CM | POA: Diagnosis not present

## 2022-01-05 DIAGNOSIS — E7800 Pure hypercholesterolemia, unspecified: Secondary | ICD-10-CM | POA: Diagnosis not present

## 2022-01-05 DIAGNOSIS — Z113 Encounter for screening for infections with a predominantly sexual mode of transmission: Secondary | ICD-10-CM | POA: Diagnosis not present

## 2022-01-05 DIAGNOSIS — Z01419 Encounter for gynecological examination (general) (routine) without abnormal findings: Secondary | ICD-10-CM | POA: Diagnosis not present

## 2022-01-05 DIAGNOSIS — E559 Vitamin D deficiency, unspecified: Secondary | ICD-10-CM | POA: Diagnosis not present

## 2022-01-05 DIAGNOSIS — R3915 Urgency of urination: Secondary | ICD-10-CM | POA: Diagnosis not present

## 2022-01-05 DIAGNOSIS — R8290 Unspecified abnormal findings in urine: Secondary | ICD-10-CM | POA: Diagnosis not present

## 2022-01-05 DIAGNOSIS — Z124 Encounter for screening for malignant neoplasm of cervix: Secondary | ICD-10-CM | POA: Diagnosis not present

## 2022-01-06 DIAGNOSIS — Z1231 Encounter for screening mammogram for malignant neoplasm of breast: Secondary | ICD-10-CM | POA: Diagnosis not present

## 2022-01-08 LAB — CYTOLOGY - PAP
Comment: NEGATIVE
Diagnosis: NEGATIVE
High risk HPV: NEGATIVE

## 2022-01-12 DIAGNOSIS — M8589 Other specified disorders of bone density and structure, multiple sites: Secondary | ICD-10-CM | POA: Diagnosis not present

## 2022-01-12 DIAGNOSIS — M810 Age-related osteoporosis without current pathological fracture: Secondary | ICD-10-CM | POA: Diagnosis not present

## 2022-01-12 DIAGNOSIS — Z780 Asymptomatic menopausal state: Secondary | ICD-10-CM | POA: Diagnosis not present

## 2022-03-24 DIAGNOSIS — M0579 Rheumatoid arthritis with rheumatoid factor of multiple sites without organ or systems involvement: Secondary | ICD-10-CM | POA: Diagnosis not present

## 2022-04-09 DIAGNOSIS — M0579 Rheumatoid arthritis with rheumatoid factor of multiple sites without organ or systems involvement: Secondary | ICD-10-CM | POA: Diagnosis not present

## 2022-04-18 ENCOUNTER — Emergency Department (HOSPITAL_BASED_OUTPATIENT_CLINIC_OR_DEPARTMENT_OTHER)
Admission: EM | Admit: 2022-04-18 | Discharge: 2022-04-18 | Disposition: A | Discharge status: Home / Self Care | Payer: Federal, State, Local not specified - PPO | Attending: Emergency Medicine | Admitting: Emergency Medicine

## 2022-04-18 ENCOUNTER — Emergency Department (HOSPITAL_BASED_OUTPATIENT_CLINIC_OR_DEPARTMENT_OTHER): Payer: Federal, State, Local not specified - PPO

## 2022-04-18 ENCOUNTER — Encounter (HOSPITAL_BASED_OUTPATIENT_CLINIC_OR_DEPARTMENT_OTHER): Payer: Self-pay

## 2022-04-18 ENCOUNTER — Other Ambulatory Visit: Payer: Self-pay

## 2022-04-18 DIAGNOSIS — R935 Abnormal findings on diagnostic imaging of other abdominal regions, including retroperitoneum: Secondary | ICD-10-CM | POA: Diagnosis not present

## 2022-04-18 DIAGNOSIS — N390 Urinary tract infection, site not specified: Secondary | ICD-10-CM | POA: Insufficient documentation

## 2022-04-18 DIAGNOSIS — N281 Cyst of kidney, acquired: Secondary | ICD-10-CM | POA: Diagnosis not present

## 2022-04-18 DIAGNOSIS — N200 Calculus of kidney: Secondary | ICD-10-CM | POA: Diagnosis not present

## 2022-04-18 DIAGNOSIS — R9389 Abnormal findings on diagnostic imaging of other specified body structures: Secondary | ICD-10-CM

## 2022-04-18 DIAGNOSIS — K760 Fatty (change of) liver, not elsewhere classified: Secondary | ICD-10-CM | POA: Diagnosis not present

## 2022-04-18 DIAGNOSIS — R1033 Periumbilical pain: Secondary | ICD-10-CM | POA: Diagnosis not present

## 2022-04-18 LAB — COMPREHENSIVE METABOLIC PANEL WITH GFR
ALT: 17 U/L (ref 0–44)
AST: 20 U/L (ref 15–41)
Albumin: 4.5 g/dL (ref 3.5–5.0)
Alkaline Phosphatase: 113 U/L (ref 38–126)
Anion gap: 12 (ref 5–15)
BUN: 13 mg/dL (ref 8–23)
CO2: 18 mmol/L — ABNORMAL LOW (ref 22–32)
Calcium: 9.1 mg/dL (ref 8.9–10.3)
Chloride: 111 mmol/L (ref 98–111)
Creatinine, Ser: 0.66 mg/dL (ref 0.44–1.00)
GFR, Estimated: >60 mL/min (ref >60)
Glucose, Bld: 94 mg/dL (ref 70–99)
Potassium: 3.8 mmol/L (ref 3.5–5.1)
Sodium: 141 mmol/L (ref 135–145)
Total Bilirubin: 0.6 mg/dL (ref 0.3–1.2)
Total Protein: 7.1 g/dL (ref 6.5–8.1)

## 2022-04-18 LAB — CBC
HCT: 42.3 % (ref 36.0–46.0)
Hemoglobin: 13.9 g/dL (ref 12.0–15.0)
MCH: 30.4 pg (ref 26.0–34.0)
MCHC: 32.9 g/dL (ref 30.0–36.0)
MCV: 92.6 fL (ref 80.0–100.0)
Platelets: 272 10*3/uL (ref 150–400)
RBC: 4.57 MIL/uL (ref 3.87–5.11)
RDW: 13.4 % (ref 11.5–15.5)
WBC: 6 10*3/uL (ref 4.0–10.5)
nRBC: 0 % (ref 0.0–0.2)

## 2022-04-18 LAB — URINALYSIS, ROUTINE W REFLEX MICROSCOPIC
Bilirubin Urine: NEGATIVE
Glucose, UA: NEGATIVE mg/dL
Hgb urine dipstick: NEGATIVE
Ketones, ur: NEGATIVE mg/dL
Nitrite: POSITIVE — AB
Protein, ur: NEGATIVE mg/dL
Specific Gravity, Urine: 1.03 (ref 1.005–1.030)
WBC, UA: >50 WBC/hpf — ABNORMAL HIGH (ref 0–5)
pH: 5.5 (ref 5.0–8.0)

## 2022-04-18 LAB — LIPASE, BLOOD: Lipase: 24 U/L (ref 11–51)

## 2022-04-18 MED ORDER — IOHEXOL 300 MG/ML  SOLN
100.0000 mL | Freq: Once | INTRAMUSCULAR | Status: AC | PRN
  Administered 2022-04-18: 100 mL via INTRAVENOUS

## 2022-04-18 MED ORDER — CEPHALEXIN 500 MG PO CAPS: 500.0000 mg | ORAL_CAPSULE | Freq: Four times a day (QID) | ORAL | 0 refills | Status: AC

## 2022-04-18 MED ORDER — SODIUM CHLORIDE 0.9 % IV SOLN
1.0000 g | Freq: Once | INTRAVENOUS | Status: AC
  Administered 2022-04-18: 1 g via INTRAVENOUS
  Filled 2022-04-18: qty 10

## 2022-04-18 MED ORDER — TRAMADOL HCL 50 MG PO TABS: 100.0000 mg | ORAL_TABLET | Freq: Once | ORAL | Status: DC

## 2022-04-18 NOTE — Discharge Instructions (Addendum)
You appear to have a urinary tract infection.  You were given a dose of antibiotic here in the emergency department tonight. You may begin your oral antibiotics in the morning Please call [facility] tomorrow morning to be seen and recheck on Thursday Your care was discussed with Dr. Bedelia Person If you begin having worsening pain, fever, chills, inability to tolerate liquids or other new symptoms you should be reevaluated at that time.

## 2022-04-18 NOTE — ED Provider Notes (Signed)
[facility] EMERGENCY DEPT Provider Note   CSN: [id number] Arrival date & time: 04/18/22  1717     History  Chief Complaint  Patient presents with   Abdominal Pain    Lori [name] is a 61 y.o. female.  HPI 61 year old female presents today complaining of abdominal pain.  States she began having some pain in the periumbilical area yesterday.  The pain then radiated to the right lower quadrant has remained there today.  She has had nausea but no active vomiting.  She has not had an appetite has not taken much by mouth today.  States that she had a similar episode 2 years ago and was told that she had appendicitis but then was told that she did not.  Had no prior surgeries on her abdomen.     Home Medications Prior to Admission medications   Medication Sig Start Date End Date Taking? Authorizing Provider  cephALEXin (KEFLEX) 500 MG capsule Take 1 capsule (500 mg total) by mouth 4 (four) times daily. 04/18/22  Yes Margarita Grizzle, MD  acetaminophen (TYLENOL) 325 MG tablet Take 650 mg by mouth every 6 (six) hours as needed for mild pain, fever or headache.    [provider]  albuterol (PROVENTIL) (2.5 MG/3ML) 0.083% nebulizer solution Take 3 mLs (2.5 mg total) by nebulization every 6 (six) hours as needed for wheezing or shortness of breath. Patient not taking: Reported on 06/24/2020 06/28/16   Oretha Milch, MD  albuterol (PROVENTIL) (2.5 MG/3ML) 0.083% nebulizer solution Take 3 mLs (2.5 mg total) by nebulization every 6 (six) hours as needed for wheezing or shortness of breath. Patient not taking: Reported on 06/24/2020 06/28/16   Oretha Milch, MD  azelastine (ASTELIN) 0.1 % nasal spray Place 2 sprays into both nostrils 2 (two) times daily as needed for allergies.  06/14/20   [provider]  buPROPion (WELLBUTRIN) 100 MG tablet Take 100 mg by mouth 2 (two) times daily.    [provider]  ciprofloxacin (CIPRO) 500 MG tablet Take 500 mg by  mouth 2 (two) times daily. 10 day supply 06/20/20   [provider]  cycloSPORINE (RESTASIS) 0.05 % ophthalmic emulsion Place 1 drop into both eyes 2 (two) times daily.    [provider]  diclofenac sodium (VOLTAREN) 1 % GEL Apply 2 g topically 4 (four) times daily as needed (pain). As needed    [provider]  guaiFENesin (ROBITUSSIN) 100 MG/5ML liquid Take 200 mg by mouth 3 (three) times daily as needed for cough.    [provider]  HYDROcodone-acetaminophen (NORCO/VICODIN) 5-325 MG tablet Take 1 tablet by mouth every 4 (four) hours as needed. 05/04/21   Lorre Nick, MD  hydroxychloroquine (PLAQUENIL) 200 MG tablet Take 200 mg by mouth 2 (two) times daily.    [provider]  metroNIDAZOLE (FLAGYL) 500 MG tablet Take 500 mg by mouth 3 (three) times daily. 10 day supply 06/20/20   [provider]  omeprazole (PRILOSEC) 20 MG capsule Take 20 mg by mouth daily.    [provider]  ondansetron (ZOFRAN) 4 MG tablet Take 1 tablet (4 mg total) by mouth every 6 (six) hours as needed for nausea or vomiting. 06/24/20   Meuth, Lina Sar, PA-C  oxyCODONE-acetaminophen (PERCOCET/ROXICET) 5-325 MG tablet Take 1 tablet by mouth every 4 (four) hours as needed for severe pain. 05/04/21   Tegeler, Canary Brim, MD  sertraline (ZOLOFT) 100 MG tablet Take 200 mg by mouth daily. Take 2 tablets  once a day     [provider]  sulfaSALAzine (AZULFIDINE) 500 MG tablet Take 1,000 mg by mouth 2 (two) times daily. 05/09/20   [provider]  SYMBICORT 160-4.5 MCG/ACT inhaler INHALE 2 PUFFS INTO THE LUNGS 2 (TWO) TIMES DAILY. Patient not taking: Reported on 06/24/2020 10/12/17   Oretha Milch, MD  topiramate (TOPAMAX) 100 MG tablet Take 300 mg by mouth at bedtime.     [provider]  traZODone (DESYREL) 100 MG tablet Take 100 mg by mouth at bedtime.    [provider]      Allergies    Penicillins and Oxycodone    Review  of Systems   Review of Systems  Physical Exam Updated Vital Signs BP 137/71 (BP Location: Right Arm)   Pulse 61   Temp 98.5 F (36.9 C) (Oral)   Resp 16   Ht 1.575 m ( )   Wt 89.8 kg   SpO2 98%   BMI 36.21 kg/m  Physical Exam Vitals and nursing note reviewed.  Constitutional:      General: She is not in acute distress.    Appearance: She is well-developed.  HENT:     Head: Normocephalic and atraumatic.     Right Ear: External ear normal.     Left Ear: External ear normal.     Nose: Nose normal.  Eyes:     Conjunctiva/sclera: Conjunctivae normal.     Pupils: Pupils are equal, round, and reactive to light.  Pulmonary:     Effort: Pulmonary effort is normal.  Abdominal:     General: Bowel sounds are normal. There is no distension.     Palpations: Abdomen is soft.     Tenderness: There is abdominal tenderness in the right upper quadrant and right lower quadrant.  Musculoskeletal:        General: Normal range of motion.     Cervical back: Normal range of motion and neck supple.  Skin:    General: Skin is warm and dry.  Neurological:     Mental Status: She is alert and oriented to person, place, and time.     Motor: No abnormal muscle tone.     Coordination: Coordination normal.  Psychiatric:        Behavior: Behavior normal.        Thought Content: Thought content normal.     ED Results / Procedures / Treatments   Labs (all labs ordered are listed, but only abnormal results are displayed) Labs Reviewed  COMPREHENSIVE METABOLIC PANEL - Abnormal; Notable for the following components:      Result Value   CO2 18 (*)    All other components within normal limits  URINALYSIS, ROUTINE W REFLEX MICROSCOPIC - Abnormal; Notable for the following components:   Color, Urine YELLOW (*)    Nitrite POSITIVE (*)    Leukocytes,Ua MODERATE (*)    WBC, UA >50 (*)    Bacteria, UA MANY (*)    Non Squamous Epithelial 0-5 (*)    All other components within normal limits  URINE  CULTURE  LIPASE, BLOOD  CBC    EKG None  Radiology CT ABDOMEN PELVIS W CONTRAST  Result Date: 04/18/2022 CLINICAL DATA:  Right lower quadrant pain EXAM: CT ABDOMEN AND PELVIS WITH CONTRAST TECHNIQUE: Multidetector CT imaging of the abdomen and pelvis was performed using the standard protocol following bolus administration of intravenous contrast. RADIATION DOSE REDUCTION: This exam was performed according to the departmental dose-optimization program which includes  automated exposure control, adjustment of the mA and/or kV according to patient size and/or use of iterative reconstruction technique. CONTRAST:  OMNIPAQUE IOHEXOL 300 MG/ML  SOLN COMPARISON:  06/24/2020 FINDINGS: Lower chest: No acute abnormality. Hepatobiliary: Fatty infiltration of the liver is noted. Gallbladder is within normal limits. Pancreas: Unremarkable. No pancreatic ductal dilatation or surrounding inflammatory changes. Spleen: Normal in size without focal abnormality. Adrenals/Urinary Tract: Adrenal glands are within normal limits. Kidneys demonstrate scattered cysts bilaterally worst in the upper pole of the right kidney measuring up to 2.7 cm. These appear simple in nature. No further follow-up is recommended. Small nonobstructing stones are noted in the lower pole of the left kidney stable in appearance from the prior exam. The largest of these measures approximately 3 mm. No ureteral stones are noted. No obstructive changes are seen. The bladder is decompressed. Stomach/Bowel: Mild central prominence of the appendix is noted measuring up to 10 mm. The overall appearance is similar to that seen on the prior exam. The peripheral appendix is within normal limits. There remains some very mild Peri appendiceal inflammatory changes. Lack of significant white blood cell count suggests that this is a chronic abnormality. Scattered diverticular change of the colon is noted without evidence of diverticulitis. No obstructive or  inflammatory changes of the colon are seen. Small bowel and stomach are within normal limits. Vascular/Lymphatic: Aortic atherosclerosis. No enlarged abdominal or pelvic lymph nodes. Reproductive: Uterus and bilateral adnexa are unremarkable. Other: No abdominal wall hernia or abnormality. No abdominopelvic ascites. Musculoskeletal: No acute or significant osseous findings. IMPRESSION: Prominence of the central aspect of the appendix relatively stable in appearance from the prior exam from 2021 and without evidence of elevated white blood cell count. This is chronic in nature and not felt to be associated with acute appendicitis. Nonobstructing left renal calculi. Fatty infiltration of the liver. Electronically Signed   By: Alcide Clever M.D.   On: 04/18/2022 19:51    Procedures Procedures    Medications Ordered in ED Medications  cefTRIAXone (ROCEPHIN) 1 g in sodium chloride 0.9 % 100 mL IVPB (has no administration in time range)  traMADol (ULTRAM) tablet 100 mg (has no administration in time range)  iohexol (OMNIPAQUE) 300 MG/ML solution 100 mL (100 mLs Intravenous Contrast Given 04/18/22 1930)    ED Course/ Medical Decision Making/ A&P Clinical Course as of 04/18/22 2037  Sun Apr 18, 2022  1817 CBC reviewed interpreted within normal limits [DR]  1817 Comprehensive metabolic panel(!) Complete metabolic panel is reviewed and interpreted and significant only for mildly decreased CO2 at 18 [DR]  2034 CBC reviewed and radiologist interpretation is noted [DR]    Clinical Course User Index [DR] Margarita Grizzle, MD                           Medical Decision Making 61 year old female presents today complaining of right-sided abdominal pain. Pain is diffuse on the right side of her abdomen CT is obtained and is significant for prominence of the central aspect of the appendix which is stable from prior exam of 2021.  Appears to be chronic in nature and not felt by radiologist to represent acute  appendicitis Patient has normal blood work here.  Urine is significant for nitrite positive with greater than 50 white blood cells and many bacteria Patient is given Rocephin here in the ED. Plan outpatient Keflex .  Discussed with Dr. Bedelia Person, on-call for surgery.  Current plan is for patient  to call office tomorrow for recheck check in clinic on Thursday I have discussed all the above with the patient.  She understands that she is having worsening pain, inability to tolerate liquids, or other change in her symptoms she should be reevaluated immediately.  Otherwise, she will continue the Keflex, and call the surgery office to be seen on Thursday.  Amount and/or Complexity of Data Reviewed Labs: ordered. Decision-making details documented in ED Course. Radiology: ordered and independent interpretation performed. Decision-making details documented in ED Course.  Risk Prescription drug management.           Final Clinical Impression(s) / ED Diagnoses Final diagnoses:  Urinary tract infection with hematuria, site unspecified  Abnormal CT scan    Rx / DC Orders ED Discharge Orders          Ordered    cephALEXin (KEFLEX) 500 MG capsule  4 times daily        04/18/22 2036              Margarita Grizzle, MD 04/18/22 2038

## 2022-04-18 NOTE — ED Triage Notes (Signed)
Patient here POV from Home.  Endorses ABD Pain (Center ABD) that began yesterday that radiates to RLQ. Constant in [place].  Some Nausea. No Emesis or Diarrhea. No Dysuria.   NAD Noted during Triage. A&Ox4. GCS 15. Ambulatory.

## 2022-04-19 ENCOUNTER — Encounter (HOSPITAL_COMMUNITY): Payer: Self-pay | Admitting: General Surgery

## 2022-04-19 ENCOUNTER — Other Ambulatory Visit: Payer: Self-pay

## 2022-04-19 ENCOUNTER — Ambulatory Visit: Payer: Self-pay | Admitting: Surgery

## 2022-04-19 DIAGNOSIS — K353 Acute appendicitis with localized peritonitis, without perforation or gangrene: Secondary | ICD-10-CM | POA: Diagnosis not present

## 2022-04-19 NOTE — H&P (View-Only) (Signed)
History of Present Illness: Lori [name] is a 61 y.o. female who was referred to me for evaluation of possible appendicitis. She was seen in the [facility] ED last night with acute abdominal pain, which began 2 nights ago. The pain is focused in the RLQ. She has not had a fever or chills. WBC was normal in the ED. She had a UA that was positive for nitrites, but she denies any urinary symptoms. CT scan showed very mild inflammatory changes around the appendix, but was interpreted as a likely chronic finding. The patient had a similar episode in 2021 that lasted for several days, and she had an outpatient CT scan which showed no signs of appendicitis.   Today she is still having RLQ abdominal pain. She was prescribed Keflex in the ED for a UTI which she has not started taking. She denies any fevers or chills. Her only prior abdominal surgery is a tubal ligation. She does have RA, for which she receives Rituximab infusions and takes plaquenil.     Review of Systems: A complete review of systems was obtained from the patient.  I have reviewed this information and discussed as appropriate with the patient.  See HPI as well for other ROS.       Medical History: Past Medical History Past Medical History: Diagnosis Date  Anxiety    Arthritis    Asthma, unspecified asthma severity, unspecified whether complicated, unspecified whether persistent    GERD (gastroesophageal reflux disease)        Patient Active Problem List Diagnosis  RLQ abdominal pain  Acute appendicitis with localized peritonitis, without perforation, abscess, or gangrene     Past Surgical History Past Surgical History: Procedure Laterality Date  ESSURE TUBAL LIGATION      retia eye      toe surgery          Allergies Allergies Allergen Reactions  Penicillins Rash     REACTION: rash      Current Outpatient Medications on File Prior to Visit Medication Sig Dispense Refill  albuterol 90 mcg/actuation inhaler 2  puffs as needed      hydrOXYchloroQUINE (PLAQUENIL) 200 mg tablet 2 tablet      omeprazole (PRILOSEC) 20 MG DR capsule 1 capsule 30 minutes before morning meal      topiramate (TOPAMAX) 100 MG tablet 1 tablet      traZODone (DESYREL) 100 MG tablet Take 1 tablet by mouth at bedtime       No current facility-administered medications on file prior to visit.     Family HistoryExpand by Default Family History Problem Relation Age of Onset  High blood pressure (Hypertension) Mother    Hyperlipidemia (Elevated cholesterol) Mother    Diabetes Father    Coronary Artery Disease (Blocked arteries around heart) Father    Obesity Sister    Diabetes Brother    Colon cancer Brother    Obesity Brother        Social History   Tobacco Use Smoking Status Never Smokeless Tobacco Not on file     Social History Social History    Socioeconomic History  Marital status: Married Tobacco Use  Smoking status: Never Substance and Sexual Activity  Alcohol use: Yes  Drug use: Never      Objective:     Vitals:   04/19/22 1608 BP: 124/72 Pulse: (!) 112 Temp: 36.8 C (98.3 F) SpO2: 97% Weight: 80.6 kg (177 lb 9.6 oz) Height: 157.5 cm (5\' 2" )   Body mass index  is 32.48 kg/m.   Physical Exam Vitals reviewed.  Constitutional:      General: She is not in acute distress.    Appearance: Normal appearance.  HENT:     Head: Normocephalic and atraumatic.  Eyes:     General: No scleral icterus.    Conjunctiva/sclera: Conjunctivae normal.  Cardiovascular:     Rate and Rhythm: Normal rate and regular rhythm.  Pulmonary:     Effort: Pulmonary effort is normal. No respiratory distress.     Breath sounds: Normal breath sounds. No wheezing.  Abdominal:     General: There is no distension.     Palpations: Abdomen is soft.     Tenderness: There is abdominal tenderness.     Comments: Focally tender to palpation in the RLQ.  Musculoskeletal:        General: Normal range of motion.      Cervical back: Normal range of motion.  Skin:    General: Skin is warm and dry.  Neurological:     General: No focal deficit present.     Mental Status: She is alert and oriented to person, place, and time.  Psychiatric:        Mood and Affect: Mood normal.        Behavior: Behavior normal.        Thought Content: Thought content normal.          Labs, Imaging and Diagnostic Testing: Stomach/Bowel: Mild central prominence of the appendix is noted measuring up to 10 mm. The overall appearance is similar to that seen on the prior exam. The peripheral appendix is within normal limits. There remains some very mild Peri appendiceal inflammatory changes. Lack of significant white blood cell count suggests that this is a chronic abnormality. Scattered diverticular change of the colon is noted without evidence of diverticulitis. No obstructive or inflammatory changes of the colon are seen. Small bowel and stomach are within normal limits.   IMPRESSION: Prominence of the central aspect of the appendix relatively stable in appearance from the prior exam from 2021 and without evidence of elevated white blood cell count. This is chronic in nature and not felt to be associated with acute appendicitis.   Nonobstructing left renal calculi.   Fatty infiltration of the liver.   Assessment and Plan: Diagnoses and all orders for this visit:   Acute appendicitis with localized peritonitis, without perforation, abscess, or gangrene   Other orders -     ciprofloxacin HCl (CIPRO) 500 MG tablet; Take 1 tablet (500 mg total) by mouth 2 (two) times daily for 2 doses       This is a 61 yo female presenting with acute RLQ pain that began about 2 days ago. Her history and exam are consistent with acute appendicitis. I reviewed her CT scan from last night and compared to prior. There is mild dilation of the proximal appendix, and there is no gas within the lumen of the appendix (this is a change from  her prior scan in 2021). In the setting of focal RLQ pain and tenderness, I feel that these findings most likely represent very early acute appendicitis, and I recommended laparoscopic appendectomy, especially as this is her second episode of RLQ pain. This will be performed tomorrow morning at [facility] by the DOW (Dr. Wakefield). Since she is clinically stable without sepsis she does not require hospital admission this evening. She received Rocephin in the ED, and I have prescribed her oral antibiotics to take tonight. She agrees   to proceed with surgery and was instructed to remain NPO after midnight tonight.  Sophronia Simas, MD [facility] Surgery General, Hepatobiliary and Pancreatic Surgery 04/19/22 5:37 PM

## 2022-04-19 NOTE — Progress Notes (Signed)
Spoke with pt for pre-op call. Pt denies cardiac history, HTN or Diabetes. Pt does have rheumatoid arthritis.  Shower instructions given to pt and she voiced understanding.

## 2022-04-19 NOTE — H&P (Signed)
History of Present Illness: Lori [name] is a 61 y.o. female who was referred to me for evaluation of possible appendicitis. She was seen in the [facility] ED last night with acute abdominal pain, which began 2 nights ago. The pain is focused in the RLQ. She has not had a fever or chills. WBC was normal in the ED. She had a UA that was positive for nitrites, but she denies any urinary symptoms. CT scan showed very mild inflammatory changes around the appendix, but was interpreted as a likely chronic finding. The patient had a similar episode in 2021 that lasted for several days, and she had an outpatient CT scan which showed no signs of appendicitis.   Today she is still having RLQ abdominal pain. She was prescribed Keflex in the ED for a UTI which she has not started taking. She denies any fevers or chills. Her only prior abdominal surgery is a tubal ligation. She does have RA, for which she receives Rituximab infusions and takes plaquenil.     Review of Systems: A complete review of systems was obtained from the patient.  I have reviewed this information and discussed as appropriate with the patient.  See HPI as well for other ROS.       Medical History: Past Medical History Past Medical History: Diagnosis Date  Anxiety    Arthritis    Asthma, unspecified asthma severity, unspecified whether complicated, unspecified whether persistent    GERD (gastroesophageal reflux disease)        Patient Active Problem List Diagnosis  RLQ abdominal pain  Acute appendicitis with localized peritonitis, without perforation, abscess, or gangrene     Past Surgical History Past Surgical History: Procedure Laterality Date  ESSURE TUBAL LIGATION      retia eye      toe surgery          Allergies Allergies Allergen Reactions  Penicillins Rash     REACTION: rash      Current Outpatient Medications on File Prior to Visit Medication Sig Dispense Refill  albuterol 90 mcg/actuation inhaler 2  puffs as needed      hydrOXYchloroQUINE (PLAQUENIL) 200 mg tablet 2 tablet      omeprazole (PRILOSEC) 20 MG DR capsule 1 capsule 30 minutes before morning meal      topiramate (TOPAMAX) 100 MG tablet 1 tablet      traZODone (DESYREL) 100 MG tablet Take 1 tablet by mouth at bedtime       No current facility-administered medications on file prior to visit.     Family HistoryExpand by Default Family History Problem Relation Age of Onset  High blood pressure (Hypertension) Mother    Hyperlipidemia (Elevated cholesterol) Mother    Diabetes Father    Coronary Artery Disease (Blocked arteries around heart) Father    Obesity Sister    Diabetes Brother    Colon cancer Brother    Obesity Brother        Social History   Tobacco Use Smoking Status Never Smokeless Tobacco Not on file     Social History Social History    Socioeconomic History  Marital status: Married Tobacco Use  Smoking status: Never Substance and Sexual Activity  Alcohol use: Yes  Drug use: Never      Objective:     Vitals:   04/19/22 1608 BP: 124/72 Pulse: (!) 112 Temp: 36.8 C (98.3 F) SpO2: 97% Weight: 80.6 kg (177 lb 9.6 oz) Height: 157.5 cm (5\' 2" )   Body mass index  is 32.48 kg/m.   Physical Exam Vitals reviewed.  Constitutional:      General: She is not in acute distress.    Appearance: Normal appearance.  HENT:     Head: Normocephalic and atraumatic.  Eyes:     General: No scleral icterus.    Conjunctiva/sclera: Conjunctivae normal.  Cardiovascular:     Rate and Rhythm: Normal rate and regular rhythm.  Pulmonary:     Effort: Pulmonary effort is normal. No respiratory distress.     Breath sounds: Normal breath sounds. No wheezing.  Abdominal:     General: There is no distension.     Palpations: Abdomen is soft.     Tenderness: There is abdominal tenderness.     Comments: Focally tender to palpation in the RLQ.  Musculoskeletal:        General: Normal range of motion.      Cervical back: Normal range of motion.  Skin:    General: Skin is warm and dry.  Neurological:     General: No focal deficit present.     Mental Status: She is alert and oriented to person, place, and time.  Psychiatric:        Mood and Affect: Mood normal.        Behavior: Behavior normal.        Thought Content: Thought content normal.          Labs, Imaging and Diagnostic Testing: Stomach/Bowel: Mild central prominence of the appendix is noted measuring up to 10 mm. The overall appearance is similar to that seen on the prior exam. The peripheral appendix is within normal limits. There remains some very mild Peri appendiceal inflammatory changes. Lack of significant white blood cell count suggests that this is a chronic abnormality. Scattered diverticular change of the colon is noted without evidence of diverticulitis. No obstructive or inflammatory changes of the colon are seen. Small bowel and stomach are within normal limits.   IMPRESSION: Prominence of the central aspect of the appendix relatively stable in appearance from the prior exam from 2021 and without evidence of elevated white blood cell count. This is chronic in nature and not felt to be associated with acute appendicitis.   Nonobstructing left renal calculi.   Fatty infiltration of the liver.   Assessment and Plan: Diagnoses and all orders for this visit:   Acute appendicitis with localized peritonitis, without perforation, abscess, or gangrene   Other orders -     ciprofloxacin HCl (CIPRO) 500 MG tablet; Take 1 tablet (500 mg total) by mouth 2 (two) times daily for 2 doses       This is a 61 yo female presenting with acute RLQ pain that began about 2 days ago. Her history and exam are consistent with acute appendicitis. I reviewed her CT scan from last night and compared to prior. There is mild dilation of the proximal appendix, and there is no gas within the lumen of the appendix (this is a change from  her prior scan in 2021). In the setting of focal RLQ pain and tenderness, I feel that these findings most likely represent very early acute appendicitis, and I recommended laparoscopic appendectomy, especially as this is her second episode of RLQ pain. This will be performed tomorrow morning at [facility] by the DOW (Dr. Dwain Sarna). Since she is clinically stable without sepsis she does not require hospital admission this evening. She received Rocephin in the ED, and I have prescribed her oral antibiotics to take tonight. She agrees  to proceed with surgery and was instructed to remain NPO after midnight tonight.  Sophronia Simas, MD [facility] Surgery General, Hepatobiliary and Pancreatic Surgery 04/19/22 5:37 PM

## 2022-04-20 ENCOUNTER — Encounter (HOSPITAL_COMMUNITY): Payer: Self-pay | Admitting: General Surgery

## 2022-04-20 ENCOUNTER — Other Ambulatory Visit: Payer: Self-pay

## 2022-04-20 ENCOUNTER — Ambulatory Visit (HOSPITAL_COMMUNITY): Payer: Federal, State, Local not specified - PPO | Admitting: Anesthesiology

## 2022-04-20 ENCOUNTER — Ambulatory Visit (HOSPITAL_COMMUNITY)
Admission: RE | Admit: 2022-04-20 | Discharge: 2022-04-20 | Disposition: A | Discharge status: Home / Self Care | Payer: Federal, State, Local not specified - PPO | Attending: General Surgery | Admitting: General Surgery

## 2022-04-20 ENCOUNTER — Encounter (HOSPITAL_COMMUNITY)
Admission: RE | Disposition: A | Discharge status: Home / Self Care | Payer: Self-pay | Source: Home / Self Care | Attending: General Surgery

## 2022-04-20 DIAGNOSIS — J45909 Unspecified asthma, uncomplicated: Secondary | ICD-10-CM | POA: Insufficient documentation

## 2022-04-20 DIAGNOSIS — M069 Rheumatoid arthritis, unspecified: Secondary | ICD-10-CM | POA: Insufficient documentation

## 2022-04-20 DIAGNOSIS — K219 Gastro-esophageal reflux disease without esophagitis: Secondary | ICD-10-CM | POA: Insufficient documentation

## 2022-04-20 DIAGNOSIS — F418 Other specified anxiety disorders: Secondary | ICD-10-CM | POA: Diagnosis not present

## 2022-04-20 DIAGNOSIS — K352 Acute appendicitis with generalized peritonitis, without abscess: Secondary | ICD-10-CM | POA: Diagnosis not present

## 2022-04-20 DIAGNOSIS — K388 Other specified diseases of appendix: Secondary | ICD-10-CM | POA: Diagnosis not present

## 2022-04-20 DIAGNOSIS — K3580 Unspecified acute appendicitis: Secondary | ICD-10-CM | POA: Diagnosis not present

## 2022-04-20 DIAGNOSIS — K353 Acute appendicitis with localized peritonitis, without perforation or gangrene: Secondary | ICD-10-CM | POA: Insufficient documentation

## 2022-04-20 HISTORY — PX: LAPAROSCOPIC APPENDECTOMY: SHX408

## 2022-04-20 HISTORY — DX: COVID-19: U07.1

## 2022-04-20 HISTORY — DX: Pneumonia, unspecified organism: J18.9

## 2022-04-20 SURGERY — APPENDECTOMY, LAPAROSCOPIC
Anesthesia: General | Site: Abdomen

## 2022-04-20 MED ORDER — 0.9 % SODIUM CHLORIDE (POUR BTL) OPTIME
TOPICAL | Status: DC | PRN
  Administered 2022-04-20: 1000 mL

## 2022-04-20 MED ORDER — SUCCINYLCHOLINE CHLORIDE 200 MG/10ML IV SOSY
PREFILLED_SYRINGE | INTRAVENOUS | Status: DC | PRN
  Administered 2022-04-20: 100 mg via INTRAVENOUS

## 2022-04-20 MED ORDER — SUGAMMADEX SODIUM 200 MG/2ML IV SOLN
INTRAVENOUS | Status: DC | PRN
  Administered 2022-04-20: 200 mg via INTRAVENOUS

## 2022-04-20 MED ORDER — FENTANYL CITRATE (PF) 250 MCG/5ML IJ SOLN
INTRAMUSCULAR | Status: DC | PRN
  Administered 2022-04-20: 100 ug via INTRAVENOUS
  Administered 2022-04-20 (×3): 50 ug via INTRAVENOUS

## 2022-04-20 MED ORDER — ACETAMINOPHEN 500 MG PO TABS
1000.0000 mg | ORAL_TABLET | Freq: Once | ORAL | Status: AC
Start: 2022-04-20 — End: 2022-04-20
  Administered 2022-04-20: 1000 mg via ORAL
  Filled 2022-04-20: qty 2

## 2022-04-20 MED ORDER — PROPOFOL 10 MG/ML IV BOLUS
INTRAVENOUS | Status: AC
  Filled 2022-04-20: qty 20

## 2022-04-20 MED ORDER — SODIUM CHLORIDE 0.9 % IV SOLN
2.0000 g | INTRAVENOUS | Status: AC
  Administered 2022-04-20: 2 g via INTRAVENOUS
  Filled 2022-04-20: qty 20

## 2022-04-20 MED ORDER — SODIUM CHLORIDE 0.9 % IR SOLN
Status: DC | PRN
  Administered 2022-04-20: 1000 mL

## 2022-04-20 MED ORDER — MIDAZOLAM HCL 2 MG/2ML IJ SOLN
INTRAMUSCULAR | Status: DC | PRN
  Administered 2022-04-20: 2 mg via INTRAVENOUS

## 2022-04-20 MED ORDER — SUCCINYLCHOLINE CHLORIDE 200 MG/10ML IV SOSY
PREFILLED_SYRINGE | INTRAVENOUS | Status: AC
Start: 2022-04-20 — End: ?
  Filled 2022-04-20: qty 10

## 2022-04-20 MED ORDER — ONDANSETRON HCL 4 MG/2ML IJ SOLN
4.0000 mg | Freq: Once | INTRAMUSCULAR | Status: DC | PRN
Start: 2022-04-20 — End: 2022-04-20

## 2022-04-20 MED ORDER — ROCURONIUM BROMIDE 10 MG/ML (PF) SYRINGE
PREFILLED_SYRINGE | INTRAVENOUS | Status: DC | PRN
  Administered 2022-04-20: 60 mg via INTRAVENOUS

## 2022-04-20 MED ORDER — MIDAZOLAM HCL 2 MG/2ML IJ SOLN
INTRAMUSCULAR | Status: AC
  Filled 2022-04-20: qty 2

## 2022-04-20 MED ORDER — LIDOCAINE 2% (20 MG/ML) 5 ML SYRINGE
INTRAMUSCULAR | Status: AC
Start: 2022-04-20 — End: ?
  Filled 2022-04-20: qty 5

## 2022-04-20 MED ORDER — METRONIDAZOLE 500 MG/100ML IV SOLN
500.0000 mg | INTRAVENOUS | Status: AC
  Administered 2022-04-20: 500 mg via INTRAVENOUS
  Filled 2022-04-20: qty 100

## 2022-04-20 MED ORDER — ORAL CARE MOUTH RINSE: 15.0000 mL | Freq: Once | OROMUCOSAL | Status: AC

## 2022-04-20 MED ORDER — PHENYLEPHRINE 80 MCG/ML (10ML) SYRINGE FOR IV PUSH (FOR BLOOD PRESSURE SUPPORT)
PREFILLED_SYRINGE | INTRAVENOUS | Status: AC
  Filled 2022-04-20: qty 10

## 2022-04-20 MED ORDER — BUPIVACAINE-EPINEPHRINE 0.25% -1:200000 IJ SOLN
INTRAMUSCULAR | Status: DC | PRN
  Administered 2022-04-20: 8 mL

## 2022-04-20 MED ORDER — BUPIVACAINE-EPINEPHRINE (PF) 0.25% -1:200000 IJ SOLN
INTRAMUSCULAR | Status: AC
  Filled 2022-04-20: qty 30

## 2022-04-20 MED ORDER — AMISULPRIDE (ANTIEMETIC) 5 MG/2ML IV SOLN: 10.0000 mg | Freq: Once | INTRAVENOUS | Status: DC | PRN

## 2022-04-20 MED ORDER — FENTANYL CITRATE (PF) 100 MCG/2ML IJ SOLN: 25.0000 ug | INTRAMUSCULAR | Status: DC | PRN

## 2022-04-20 MED ORDER — EPHEDRINE 5 MG/ML INJ
INTRAVENOUS | Status: AC
Start: 2022-04-20 — End: ?
  Filled 2022-04-20: qty 5

## 2022-04-20 MED ORDER — ONDANSETRON HCL 4 MG/2ML IJ SOLN
INTRAMUSCULAR | Status: AC
  Filled 2022-04-20: qty 2

## 2022-04-20 MED ORDER — DEXAMETHASONE SODIUM PHOSPHATE 10 MG/ML IJ SOLN
INTRAMUSCULAR | Status: DC | PRN
  Administered 2022-04-20: 10 mg via INTRAVENOUS

## 2022-04-20 MED ORDER — FENTANYL CITRATE (PF) 250 MCG/5ML IJ SOLN
INTRAMUSCULAR | Status: AC
  Filled 2022-04-20: qty 5

## 2022-04-20 MED ORDER — CHLORHEXIDINE GLUCONATE 0.12 % MT SOLN
15.0000 mL | Freq: Once | OROMUCOSAL | Status: AC
  Administered 2022-04-20: 15 mL via OROMUCOSAL
  Filled 2022-04-20: qty 15

## 2022-04-20 MED ORDER — OXYCODONE HCL 5 MG PO TABS: 5.0000 mg | ORAL_TABLET | Freq: Once | ORAL | Status: DC | PRN

## 2022-04-20 MED ORDER — DEXMEDETOMIDINE (PRECEDEX) IN NS 20 MCG/5ML (4 MCG/ML) IV SYRINGE
PREFILLED_SYRINGE | INTRAVENOUS | Status: DC | PRN
  Administered 2022-04-20 (×2): 10 ug via INTRAVENOUS

## 2022-04-20 MED ORDER — PROPOFOL 10 MG/ML IV BOLUS
INTRAVENOUS | Status: DC | PRN
  Administered 2022-04-20: 150 mg via INTRAVENOUS

## 2022-04-20 MED ORDER — OXYCODONE HCL 5 MG/5ML PO SOLN: 5.0000 mg | Freq: Once | ORAL | Status: DC | PRN

## 2022-04-20 MED ORDER — PHENYLEPHRINE 80 MCG/ML (10ML) SYRINGE FOR IV PUSH (FOR BLOOD PRESSURE SUPPORT)
PREFILLED_SYRINGE | INTRAVENOUS | Status: DC | PRN
  Administered 2022-04-20: 160 ug via INTRAVENOUS

## 2022-04-20 MED ORDER — ONDANSETRON HCL 4 MG/2ML IJ SOLN
INTRAMUSCULAR | Status: DC | PRN
  Administered 2022-04-20 (×2): 4 mg via INTRAVENOUS

## 2022-04-20 MED ORDER — EPHEDRINE SULFATE-NACL 50-0.9 MG/10ML-% IV SOSY
PREFILLED_SYRINGE | INTRAVENOUS | Status: DC | PRN
  Administered 2022-04-20: 10 mg via INTRAVENOUS

## 2022-04-20 MED ORDER — EPHEDRINE 5 MG/ML INJ
INTRAVENOUS | Status: AC
  Filled 2022-04-20: qty 5

## 2022-04-20 MED ORDER — ROCURONIUM BROMIDE 10 MG/ML (PF) SYRINGE
PREFILLED_SYRINGE | INTRAVENOUS | Status: AC
  Filled 2022-04-20: qty 10

## 2022-04-20 MED ORDER — LIDOCAINE 2% (20 MG/ML) 5 ML SYRINGE
INTRAMUSCULAR | Status: DC | PRN
  Administered 2022-04-20: 100 mg via INTRAVENOUS

## 2022-04-20 MED ORDER — DEXAMETHASONE SODIUM PHOSPHATE 10 MG/ML IJ SOLN
INTRAMUSCULAR | Status: AC
  Filled 2022-04-20: qty 1

## 2022-04-20 MED ORDER — LACTATED RINGERS IV SOLN: INTRAVENOUS | Status: DC

## 2022-04-20 MED ORDER — TRAMADOL HCL 50 MG PO TABS: 100.0000 mg | ORAL_TABLET | Freq: Four times a day (QID) | ORAL | 0 refills | Status: AC | PRN

## 2022-04-20 SURGICAL SUPPLY — 42 items
BAG COUNTER SPONGE SURGICOUNT (BAG) ×2 IMPLANT
CANISTER SUCT 3000ML PPV (MISCELLANEOUS) ×2 IMPLANT
CHLORAPREP W/TINT 26 (MISCELLANEOUS) ×2 IMPLANT
COVER SURGICAL LIGHT HANDLE (MISCELLANEOUS) ×2 IMPLANT
CUTTER FLEX LINEAR 45M (STAPLE) ×2 IMPLANT
DERMABOND ADVANCED (GAUZE/BANDAGES/DRESSINGS) ×1
DERMABOND ADVANCED .7 DNX12 (GAUZE/BANDAGES/DRESSINGS) ×1 IMPLANT
ELECT REM PT RETURN 9FT ADLT (ELECTROSURGICAL) ×2
ELECTRODE REM PT RTRN 9FT ADLT (ELECTROSURGICAL) ×1 IMPLANT
GLOVE BIO SURGEON STRL SZ7 (GLOVE) ×2 IMPLANT
GLOVE BIOGEL PI IND STRL 7.5 (GLOVE) ×1 IMPLANT
GLOVE BIOGEL PI INDICATOR 7.5 (GLOVE) ×1
GOWN STRL REUS W/ TWL LRG LVL3 (GOWN DISPOSABLE) ×3 IMPLANT
GOWN STRL REUS W/TWL LRG LVL3 (GOWN DISPOSABLE) ×6
GRASPER SUT TROCAR 14GX15 (MISCELLANEOUS) ×2 IMPLANT
KIT BASIN OR (CUSTOM PROCEDURE TRAY) ×2 IMPLANT
KIT TURNOVER KIT B (KITS) ×2 IMPLANT
NS IRRIG 1000ML POUR BTL (IV SOLUTION) ×2 IMPLANT
PAD ARMBOARD 7.5X6 YLW CONV (MISCELLANEOUS) ×4 IMPLANT
POUCH RETRIEVAL ECOSAC 10 (ENDOMECHANICALS) ×1 IMPLANT
POUCH RETRIEVAL ECOSAC 10MM (ENDOMECHANICALS) ×2
RELOAD 45 VASCULAR/THIN (ENDOMECHANICALS) IMPLANT
RELOAD STAPLE 45 2.5 WHT GRN (ENDOMECHANICALS) IMPLANT
RELOAD STAPLE 45 3.5 BLU ETS (ENDOMECHANICALS) IMPLANT
RELOAD STAPLE TA45 3.5 REG BLU (ENDOMECHANICALS) ×2 IMPLANT
SCISSORS LAP 5X35 DISP (ENDOMECHANICALS) IMPLANT
SET IRRIG TUBING LAPAROSCOPIC (IRRIGATION / IRRIGATOR) ×2 IMPLANT
SET TUBE SMOKE EVAC HIGH FLOW (TUBING) ×2 IMPLANT
SHEARS HARMONIC ACE PLUS 36CM (ENDOMECHANICALS) ×2 IMPLANT
SLEEVE ENDOPATH XCEL 5M (ENDOMECHANICALS) ×2 IMPLANT
SLEEVE Z-THREAD 5X100MM (TROCAR) ×1 IMPLANT
SPECIMEN JAR SMALL (MISCELLANEOUS) ×2 IMPLANT
STRIP CLOSURE SKIN 1/2X4 (GAUZE/BANDAGES/DRESSINGS) ×2 IMPLANT
SUT MNCRL AB 4-0 PS2 18 (SUTURE) ×2 IMPLANT
SUT VICRYL 0 UR6 27IN ABS (SUTURE) ×2 IMPLANT
TOWEL GREEN STERILE (TOWEL DISPOSABLE) ×2 IMPLANT
TOWEL GREEN STERILE FF (TOWEL DISPOSABLE) ×2 IMPLANT
TRAY LAPAROSCOPIC MC (CUSTOM PROCEDURE TRAY) ×2 IMPLANT
TROCAR 11X100 Z THREAD (TROCAR) ×1 IMPLANT
TROCAR BALLN 12MMX100 BLUNT (TROCAR) IMPLANT
TROCAR Z-THREAD OPTICAL 5X100M (TROCAR) ×2 IMPLANT
WATER STERILE IRR 1000ML POUR (IV SOLUTION) ×2 IMPLANT

## 2022-04-20 NOTE — Anesthesia Postprocedure Evaluation (Signed)
Anesthesia Post Note  Patient: Lori [name]  Procedure(s) Performed: APPENDECTOMY LAPAROSCOPIC (Abdomen)     Patient location during evaluation: PACU Anesthesia Type: General [name] of consciousness: awake and alert Pain management: pain [name] controlled Vital Signs Assessment: post-procedure vital signs reviewed and stable Respiratory status: spontaneous breathing, nonlabored ventilation and respiratory function stable Cardiovascular status: blood pressure returned to baseline and stable Postop Assessment: no apparent nausea or vomiting Anesthetic complications: no   No notable events documented.  Last Vitals:  Vitals:   04/20/22 1110 04/20/22 1125  BP: (!) 140/70 118/64  Pulse: 62 (!) 59  Resp: 10 12  Temp:  36.4 C  SpO2: 99% 97%    Last Pain:  Vitals:   04/20/22 1125  TempSrc:   PainSc: 0-No pain                 Lucretia Kern

## 2022-04-20 NOTE — Transfer of Care (Signed)
Immediate Anesthesia Transfer of Care Note  Patient: Lori [name]  Procedure(s) Performed: APPENDECTOMY LAPAROSCOPIC (Abdomen)  Patient Location: PACU  Anesthesia Type:General  Level of Consciousness: drowsy and patient cooperative  Airway & Oxygen Therapy: Patient Spontanous Breathing  Post-op Assessment: Report given to RN and Post -op Vital signs reviewed and stable  Post vital signs: Reviewed and stable  Last Vitals:  Vitals Value Taken Time  BP 153/85 04/20/22 1056  Temp    Pulse 70 04/20/22 1058  Resp 13 04/20/22 1058  SpO2 94 % 04/20/22 1058  Vitals shown include unvalidated device data.  Last Pain:  Vitals:   04/20/22 0802  TempSrc:   PainSc: 0-No pain         Complications: No notable events documented.

## 2022-04-20 NOTE — Interval H&P Note (Signed)
History and Physical Interval Note:  04/20/2022 9:12 AM I have seen and examined patient. Still with rlq pain.  Discussed could be related to appendix but might be negative. Agree with proceeding with lap appy today given exam and history Lori [name]  has presented today for surgery, with the diagnosis of Acute Appendicitis.  The various methods of treatment have been discussed with the patient and family. After consideration of risks, benefits and other options for treatment, the patient has consented to  Procedure(s): APPENDECTOMY LAPAROSCOPIC (N/A) as a surgical intervention.  The patient's history has been reviewed, patient examined, no change in status, stable for surgery.  I have reviewed the patient's chart and labs.  Questions were answered to the patient's satisfaction.     [name]

## 2022-04-20 NOTE — Discharge Instructions (Signed)
[facility]  Please arrive at least 30 min before your appointment to complete your check in paperwork.  If you are unable to arrive 30 min prior to your appointment time we may have to cancel or reschedule you. LAPAROSCOPIC SURGERY: POST OP INSTRUCTIONS Always review your discharge instruction sheet given to you by the facility where your surgery was performed. IF YOU HAVE DISABILITY OR FAMILY LEAVE FORMS, YOU MUST BRING THEM TO THE OFFICE FOR PROCESSING.   DO NOT GIVE THEM TO YOUR DOCTOR.  PAIN CONTROL  First take acetaminophen (Tylenol) AND/or ibuprofen (Advil) to control your pain after surgery.  Follow directions on package.  Taking acetaminophen (Tylenol) and/or ibuprofen (Advil) regularly after surgery will help to control your pain and lower the amount of prescription pain medication you may need.  You should not take more than 4,000 mg (4 grams) of acetaminophen (Tylenol) in 24 hours.  You should not take ibuprofen (Advil), aleve, motrin, naprosyn or other NSAIDS if you have a history of stomach ulcers or chronic kidney disease.  A prescription for pain medication may be given to you upon discharge.  Take your pain medication as prescribed, if you still have uncontrolled pain after taking acetaminophen (Tylenol) or ibuprofen (Advil). Use ice packs to help control pain. If you need a refill on your pain medication, please contact your pharmacy.  They will contact our office to request authorization. Prescriptions will not be filled after 5pm or on week-ends.  HOME MEDICATIONS Take your usually prescribed medications unless otherwise directed.  DIET You should follow a light diet the first few days after arrival home.  Be sure to include lots of fluids daily. Avoid fatty, fried foods.   CONSTIPATION It is common to experience some constipation after surgery and if you are taking pain medication.  Increasing fluid intake and taking a stool softener (such as Colace)  will usually help or prevent this problem from occurring.  A mild laxative (Milk of Magnesia or Miralax) should be taken according to package instructions if there are no bowel movements after 48 hours.  WOUND/INCISION CARE Most patients will experience some swelling and bruising in the area of the incisions.  Ice packs will help.  Swelling and bruising can take several days to resolve.  Unless discharge instructions indicate otherwise, follow guidelines below  STERI-STRIPS - you may remove your outer bandages 48 hours after surgery, and you may shower at that time.  You have steri-strips (small skin tapes) in place directly over the incision.  These strips should be left on the skin for 7-10 days.   DERMABOND/SKIN GLUE - you may shower in 24 hours.  The glue will flake off over the next 2-3 weeks. Any sutures or staples will be removed at the office during your follow-up visit.  ACTIVITIES You may resume regular (light) daily activities beginning the next day--such as daily self-care, walking, climbing stairs--gradually increasing activities as tolerated.  You may have sexual intercourse when it is comfortable.  Refrain from any heavy lifting or straining until approved by your doctor. You may drive when you are no longer taking prescription pain medication, you can comfortably wear a seatbelt, and you can safely maneuver your car and apply brakes.  FOLLOW-UP You should see your doctor in the office for a follow-up appointment approximately 2-3 weeks after your surgery.  You should have been given your post-op/follow-up appointment when your surgery was scheduled.  If you did not receive a post-op/follow-up appointment, make sure   that you call for this appointment within a day or two after you arrive home to insure a convenient appointment time.  OTHER INSTRUCTIONS  WHEN TO CALL YOUR DOCTOR: Fever over 101.0 Inability to urinate Continued bleeding from incision. Increased pain, redness, or  drainage from the incision. Increasing abdominal pain  The clinic staff is available to answer your questions during regular business hours.  Please don't hesitate to call and ask to speak to one of the nurses for clinical concerns.  If you have a medical emergency, go to the nearest emergency room or call 911.  A surgeon from [facility] is always on call at the hospital. [address], [address], [place], [place]  [address] ? [address] [address], [place], [place]   [address] [phone] ? [phone] ? FAX [phone]   

## 2022-04-20 NOTE — Op Note (Signed)
Preoperative diagnosis: Appendicitis Postoperative diagnosis: Acute appendicitis Procedure: Laparoscopic appendectomy Surgeon: Dr. Harden Mo Anesthesia: General Estimated blood loss: Minimal Complications: None Drains: None Specimens: Appendix to pathology Sponge needle count was correct at completion Disposition to recovery stable condition  Indications: This is 61 year old female who has a history 2 years ago where it sounds like she likely did have appendicitis although she was evaluated at that time that got better with antibiotics alone.  She has had a colonoscopy.  She has had pain that began on Sunday.  This is in her right lower quadrant.  She had a UA that was positive without prep and denies any urinary symptoms.  CT showed some very mild inflammatory changes and was interpreted as chronic.  She was seen in our office yesterday for continued right lower quadrant pain.  She does receive rituximab and is on Plaquenil.  She was seen by Dr. Freida Busman and thought that she needed an appendectomy.  I saw her this morning and agreed   Procedure: After informed consent was obtained the patient was taken to the operating room.  She was given antibiotics.  SCDs were in place.  She was placed under general anesthesia.  She was prepped and draped in standard sterile surgical fashion.  Surgical timeout performed.  I infiltrated Marcaine in the left upper quadrant.  I made a small incision.  I then accessed the abdomen with a 5 mm trocar using direct optical entry.  This was done without injury.  I then insufflated the abdomen to 15 mmHg pressure.  I placed a 5 mm trocar in the suprapubic region and a 12 mm trocar in the left lower abdomen.  I then was able to rotate the omentum.  The terminal ileum and cecum were both identified.  The appendix was quite abnormal with exudate indicating that she did have appendicitis.  I was able to free this from the surrounding structures and I took the appendiceal  mesentery with the harmonic scalpel.  I took this to the base.  I then divided the appendix at the base including small cuff of the cecum with a GIA stapler.  This was placed in a retrieval bag and passed off the table.  Hemostasis was observed.  The staple line looked good.  I then removed my 12 mm trocar.  I placed 2-0 Vicryl sutures using the suture passer device to completely obliterate the defect.  I then desufflated the abdomen to remove the remaining trocars.  These were closed with 4-0 Monocryl and glue.  She tolerated this well was extubated transferred to recovery stable.

## 2022-04-20 NOTE — Anesthesia Procedure Notes (Signed)
Procedure Name: Intubation Date/Time: 04/20/2022 10:13 AM  Performed by: Rosiland Oz, CRNAPre-anesthesia Checklist: Patient identified, Emergency Drugs available, Suction available, Patient being monitored and Timeout performed Patient Re-evaluated:Patient Re-evaluated prior to induction Oxygen Delivery Method: Circle system utilized Preoxygenation: Pre-oxygenation with 100% oxygen Induction Type: IV induction, Rapid sequence and Cricoid Pressure applied Laryngoscope Size: Miller and 3 Grade View: Grade I Tube type: Oral Tube size: 7.0 mm Number of attempts: 1 Airway Equipment and Method: Stylet Placement Confirmation: ETT inserted through vocal cords under direct vision, positive ETCO2 and breath sounds checked- equal and bilateral Secured at: 21 cm Tube secured with: Tape Dental Injury: Teeth and Oropharynx as per pre-operative assessment

## 2022-04-20 NOTE — Anesthesia Preprocedure Evaluation (Signed)
Anesthesia Evaluation  Patient identified by MRN, date of birth, ID band Patient awake    Reviewed: Allergy & Precautions, NPO status , Patient's Chart, lab work & pertinent test results  History of Anesthesia Complications Negative for: history of anesthetic complications  Airway Mallampati: II  TM Distance: >3 FB Neck ROM: Full    Dental  (+) Dental Advisory Given, Teeth Intact   Pulmonary asthma ,    Pulmonary exam normal        Cardiovascular negative cardio ROS Normal cardiovascular exam     Neuro/Psych Anxiety Depression negative neurological ROS     GI/Hepatic Neg liver ROS, GERD  ,Acute appendicitis   Endo/Other  negative endocrine ROS  Renal/GU negative Renal ROS  negative genitourinary   Musculoskeletal  (+) Arthritis , Rheumatoid disorders,    Abdominal   Peds  Hematology negative hematology ROS (+)   Anesthesia Other Findings   Reproductive/Obstetrics                             Anesthesia Physical Anesthesia Plan  ASA: 2 and emergent  Anesthesia Plan: General   Post-op Pain Management: Toradol IV (intra-op)* and Tylenol PO (pre-op)*   Induction: Intravenous and Rapid sequence  PONV Risk Score and Plan: 4 or greater and Ondansetron, Dexamethasone, Treatment may vary due to age or medical condition and Midazolam  Airway Management Planned: Oral ETT  Additional Equipment: None  Intra-op Plan:   Post-operative Plan: Extubation in OR  Informed Consent: I have reviewed the patients History and Physical, chart, labs and discussed the procedure including the risks, benefits and alternatives for the proposed anesthesia with the patient or authorized representative who has indicated his/her understanding and acceptance.     Dental advisory given  Plan Discussed with:   Anesthesia Plan Comments:         Anesthesia Quick Evaluation

## 2022-04-21 ENCOUNTER — Encounter (HOSPITAL_COMMUNITY): Payer: Self-pay | Admitting: General Surgery

## 2022-04-21 LAB — SURGICAL PATHOLOGY

## 2022-04-21 LAB — URINE CULTURE: Culture: >=100000 — AB

## 2022-04-22 ENCOUNTER — Telehealth: Payer: Self-pay | Admitting: *Deleted

## 2022-04-22 NOTE — Telephone Encounter (Signed)
Post ED Visit - Positive Culture Follow-up  Culture report reviewed by antimicrobial stewardship pharmacist: [facility] []  , Pharm.D. []  Enzo Bi, Pharm.D., BCPS AQ-ID []  , Pharm.D., BCPS []  Celedonio Miyamoto, Pharm.D., BCPS []  [place], Garvin Fila.D., BCPS, AAHIVP []  , Pharm.D., BCPS, AAHIVP []  Georgina Pillion, PharmD, BCPS []  , PharmD, BCPS []  [place], PharmD, BCPS []  [address], PharmD []  , PharmD, BCPS []  Estella Husk, PharmD  Pharmacy Team []  Lysle Pearl, PharmD []  , PharmD []  Phillips Climes, PharmD []  , Rph []  [employer]) , PharmD []  Verlan Friends, PharmD []  , PharmD []  Mervyn Gay, PharmD []  , PharmD []  Vinnie Level, PharmD []  [facility], PharmD []  , PharmD []  Len Childs, PharmD   Positive urine culture Treated with Cephalexin, organism sensitive to the same and no further patient follow-up is required at this time.  [facility] 04/22/2022, 9:57 AM

## 2022-05-09 IMAGING — CT CT ABD-PELV W/ CM
2 of 5 series · 16 of 46 positions shown, 18 images · IV contrast (omnipaque)
Comparison: None.

CLINICAL DATA: Right lower quadrant abdominal pain

EXAM:
CT ABDOMEN AND PELVIS WITH CONTRAST
TECHNIQUE: Multidetector CT imaging of the abdomen and pelvis was performed
using the standard protocol following bolus administration of
intravenous contrast.
CONTRAST:  100mL OMNIPAQUE IOHEXOL 300 MG/ML  SOLN

[Series 2: axial st · axial · 0.93mm/px · z∈[+1064,+1494]mm · 13 of 100 slices shown, 15 images]
[im 7/100  soft-tissue]
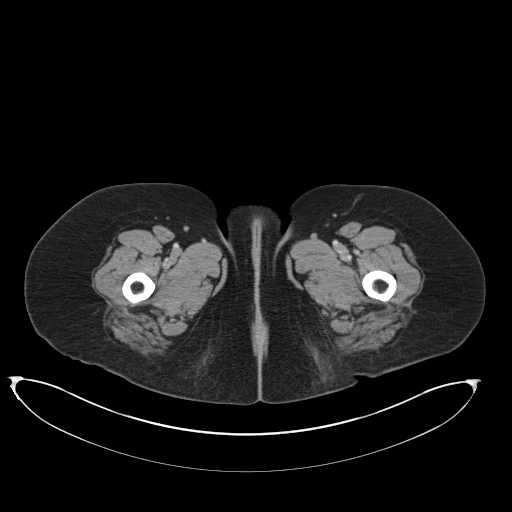
[im 7/100  bone]
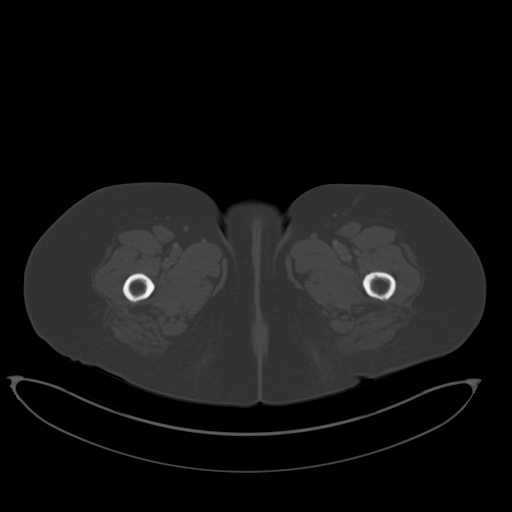
[im 13/100  soft-tissue]
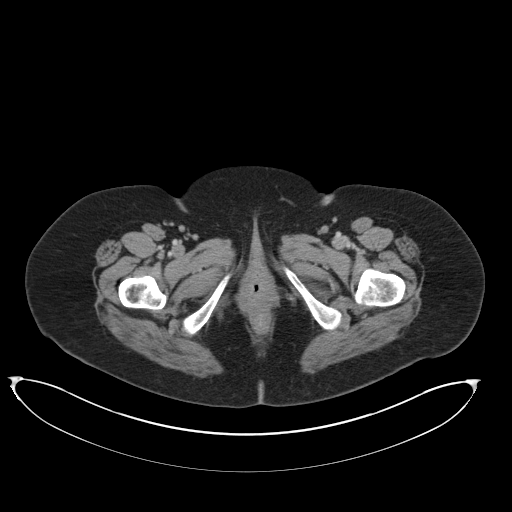
[im 19/100  soft-tissue]
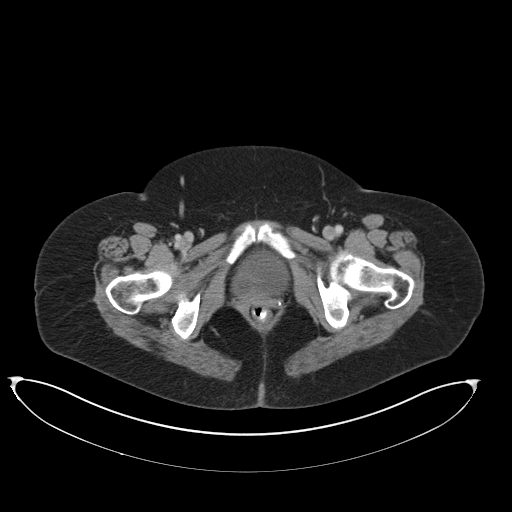
[im 31/100  soft-tissue]
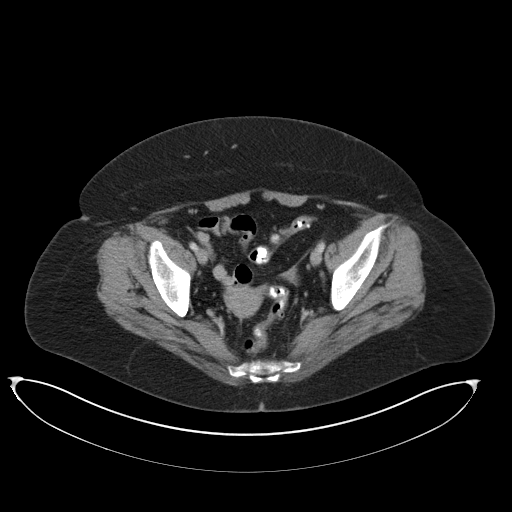
[im 38/100  soft-tissue]
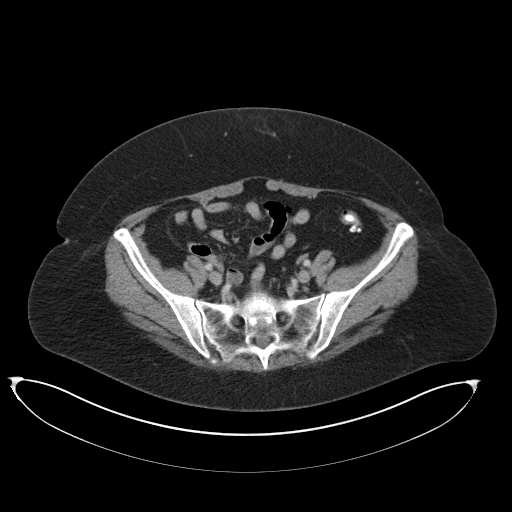
[im 44/100  soft-tissue]
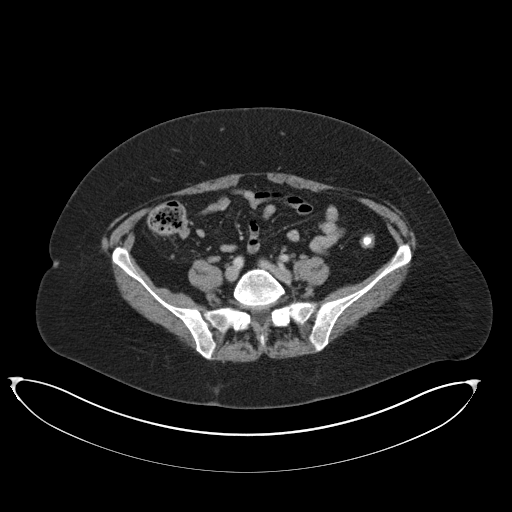
[im 50/100  soft-tissue]
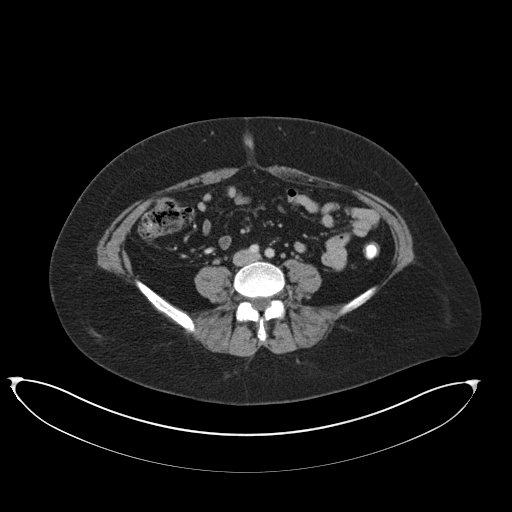
[im 56/100  soft-tissue]
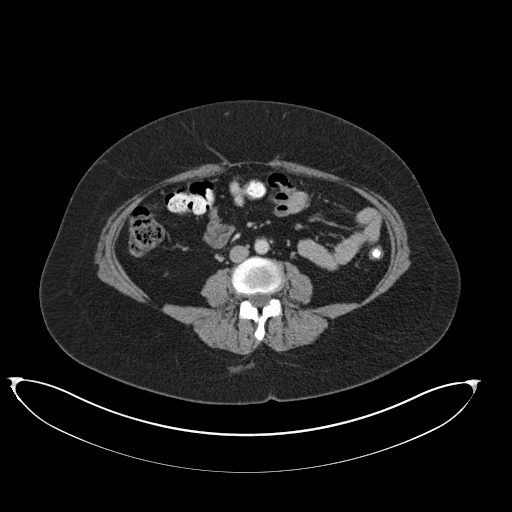
[im 62/100  soft-tissue]
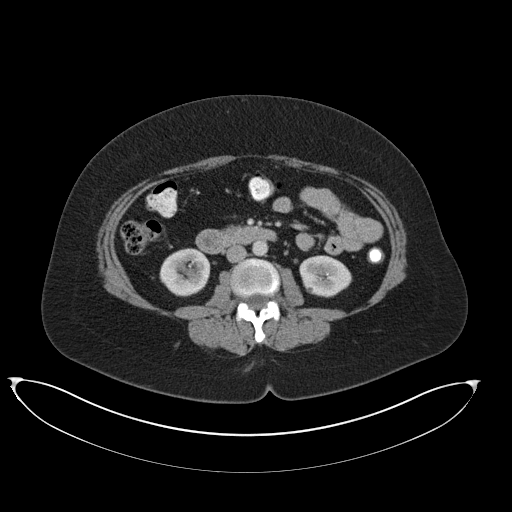
[im 62/100  bone]
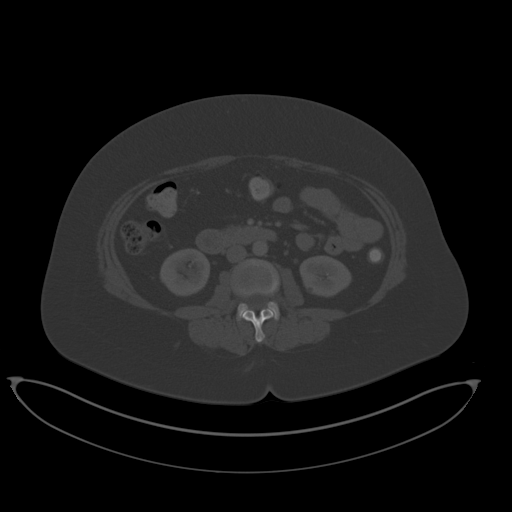
[im 69/100  soft-tissue]
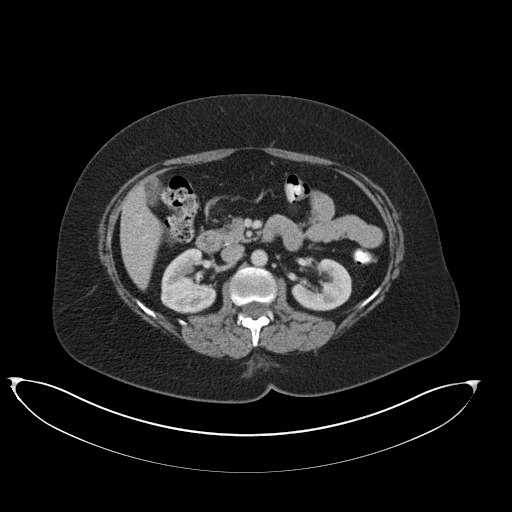
[im 81/100  soft-tissue]
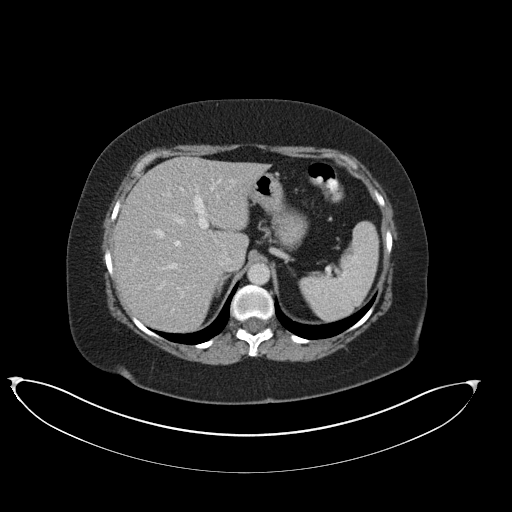
[im 87/100  soft-tissue]
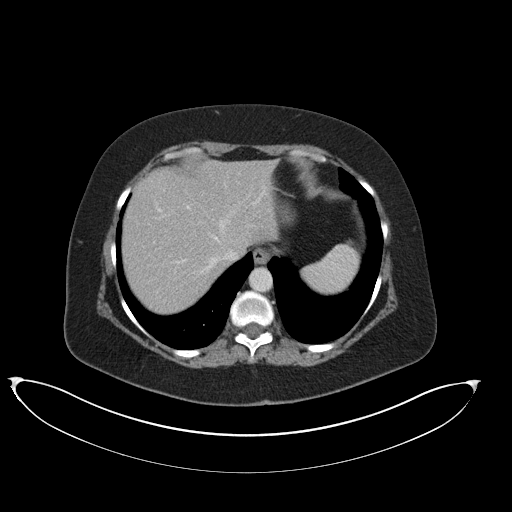
[im 93/100  soft-tissue]
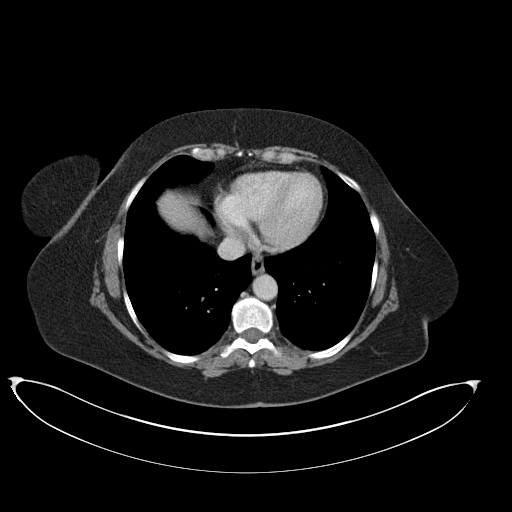

[Series 4: coronal st · coronal · 0.82mm/px · 3 of 150 slices shown]
[im 50/150  soft-tissue]
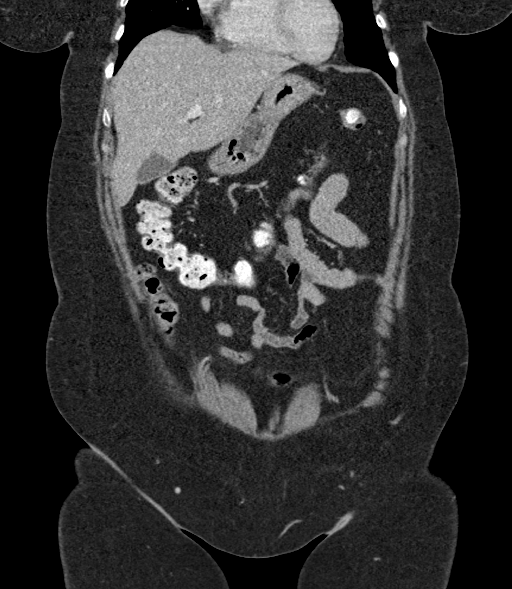
[im 67/150  soft-tissue]
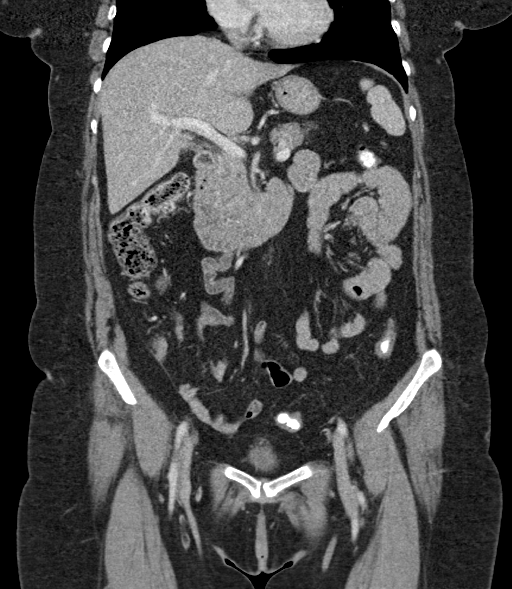
[im 83/150  soft-tissue]
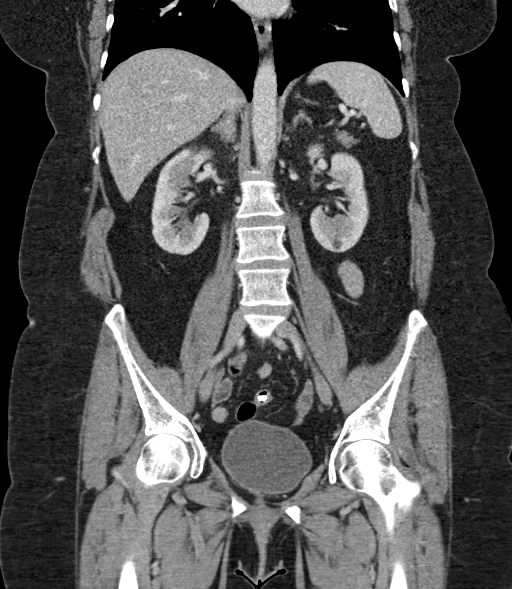

[16 of 46 positions shown; findings below may reference images not displayed]

FINDINGS: Lower chest: No acute abnormality.

Hepatobiliary: No focal liver abnormality is seen. No gallstones,
gallbladder wall thickening, or biliary dilatation.

Pancreas: Unremarkable. No pancreatic ductal dilatation or
surrounding inflammatory changes.

Spleen: Normal in size without focal abnormality.

Adrenals/Urinary Tract: Unremarkable adrenal glands. Multiple small
low-density lesions scattered throughout both kidneys, largest at
the upper pole the right kidney measuring up to 2.6 cm which is
compatible with a cyst. Additional lesions are too small to
definitively characterize, but also likely represent cysts. There
are 2 small punctate nonobstructing left renal stones measuring up
to 3 mm. No hydronephrosis. Ureters are unremarkable. Urinary
bladder is unremarkable.

Stomach/Bowel: Stomach is within normal limits. Appendix is upper
limits of normal in diameter without periappendiceal stranding or
fluid (series 2, images 60-61). There are a few scattered
diverticula within the sigmoid colon. No evidence of bowel wall
thickening, distention, or inflammatory changes.

Vascular/Lymphatic: No significant vascular findings are present. No
enlarged abdominal or pelvic lymph nodes.

Reproductive: Uterus and bilateral adnexa are unremarkable.

Other: No free fluid. No abdominopelvic fluid collection. No
pneumoperitoneum. No abdominal wall hernia.

Musculoskeletal: No acute or significant osseous findings.
IMPRESSION: 1. No acute abdominopelvic findings.
2. Appendix is upper limits of normal in diameter without secondary
findings to suggest acute appendicitis.
3. Bilateral renal cysts with nonobstructing left-sided
nephrolithiasis.
4. Sigmoid diverticulosis without evidence of acute diverticulitis.

## 2022-06-10 DIAGNOSIS — Z79899 Other long term (current) drug therapy: Secondary | ICD-10-CM | POA: Diagnosis not present

## 2022-06-10 DIAGNOSIS — Z23 Encounter for immunization: Secondary | ICD-10-CM | POA: Diagnosis not present

## 2022-06-10 DIAGNOSIS — Z79620 Long term (current) use of immunosuppressive biologic: Secondary | ICD-10-CM | POA: Diagnosis not present

## 2022-06-10 DIAGNOSIS — M2550 Pain in unspecified joint: Secondary | ICD-10-CM | POA: Diagnosis not present

## 2022-06-10 DIAGNOSIS — M0579 Rheumatoid arthritis with rheumatoid factor of multiple sites without organ or systems involvement: Secondary | ICD-10-CM | POA: Diagnosis not present

## 2022-06-10 DIAGNOSIS — M0589 Other rheumatoid arthritis with rheumatoid factor of multiple sites: Secondary | ICD-10-CM | POA: Diagnosis not present

## 2022-07-12 DIAGNOSIS — F3341 Major depressive disorder, recurrent, in partial remission: Secondary | ICD-10-CM | POA: Diagnosis not present

## 2022-07-12 DIAGNOSIS — F419 Anxiety disorder, unspecified: Secondary | ICD-10-CM | POA: Diagnosis not present

## 2022-07-12 DIAGNOSIS — G4700 Insomnia, unspecified: Secondary | ICD-10-CM | POA: Diagnosis not present

## 2022-07-12 DIAGNOSIS — G43909 Migraine, unspecified, not intractable, without status migrainosus: Secondary | ICD-10-CM | POA: Diagnosis not present

## 2022-07-28 DIAGNOSIS — M810 Age-related osteoporosis without current pathological fracture: Secondary | ICD-10-CM | POA: Diagnosis not present

## 2022-10-12 DIAGNOSIS — M0579 Rheumatoid arthritis with rheumatoid factor of multiple sites without organ or systems involvement: Secondary | ICD-10-CM | POA: Diagnosis not present

## 2022-10-26 DIAGNOSIS — M0579 Rheumatoid arthritis with rheumatoid factor of multiple sites without organ or systems involvement: Secondary | ICD-10-CM | POA: Diagnosis not present

## 2022-11-21 DIAGNOSIS — J208 Acute bronchitis due to other specified organisms: Secondary | ICD-10-CM | POA: Diagnosis not present

## 2022-12-13 DIAGNOSIS — M25521 Pain in right elbow: Secondary | ICD-10-CM | POA: Diagnosis not present

## 2022-12-13 DIAGNOSIS — M25531 Pain in right wrist: Secondary | ICD-10-CM | POA: Diagnosis not present

## 2022-12-13 DIAGNOSIS — M0579 Rheumatoid arthritis with rheumatoid factor of multiple sites without organ or systems involvement: Secondary | ICD-10-CM | POA: Diagnosis not present

## 2022-12-13 DIAGNOSIS — M25532 Pain in left wrist: Secondary | ICD-10-CM | POA: Diagnosis not present

## 2023-01-04 ENCOUNTER — Other Ambulatory Visit: Payer: Self-pay | Admitting: Family Medicine

## 2023-01-04 ENCOUNTER — Ambulatory Visit
Admission: RE | Admit: 2023-01-04 | Discharge: 2023-01-04 | Disposition: A | Discharge status: Home / Self Care | Payer: Federal, State, Local not specified - PPO | Source: Ambulatory Visit | Attending: Family Medicine | Admitting: Family Medicine

## 2023-01-04 DIAGNOSIS — R058 Other specified cough: Secondary | ICD-10-CM

## 2023-01-04 DIAGNOSIS — R059 Cough, unspecified: Secondary | ICD-10-CM | POA: Diagnosis not present

## 2023-01-04 DIAGNOSIS — R0602 Shortness of breath: Secondary | ICD-10-CM

## 2023-01-10 DIAGNOSIS — J4530 Mild persistent asthma, uncomplicated: Secondary | ICD-10-CM | POA: Diagnosis not present

## 2023-01-10 DIAGNOSIS — Z79899 Other long term (current) drug therapy: Secondary | ICD-10-CM | POA: Diagnosis not present

## 2023-01-10 DIAGNOSIS — M069 Rheumatoid arthritis, unspecified: Secondary | ICD-10-CM | POA: Diagnosis not present

## 2023-01-10 DIAGNOSIS — Z0000 Encounter for general adult medical examination without abnormal findings: Secondary | ICD-10-CM | POA: Diagnosis not present

## 2023-01-10 DIAGNOSIS — G43909 Migraine, unspecified, not intractable, without status migrainosus: Secondary | ICD-10-CM | POA: Diagnosis not present

## 2023-01-10 DIAGNOSIS — E7800 Pure hypercholesterolemia, unspecified: Secondary | ICD-10-CM | POA: Diagnosis not present

## 2023-01-12 DIAGNOSIS — Z1231 Encounter for screening mammogram for malignant neoplasm of breast: Secondary | ICD-10-CM | POA: Diagnosis not present

## 2023-01-17 DIAGNOSIS — H2513 Age-related nuclear cataract, bilateral: Secondary | ICD-10-CM | POA: Diagnosis not present

## 2023-01-17 DIAGNOSIS — M0569 Rheumatoid arthritis of multiple sites with involvement of other organs and systems: Secondary | ICD-10-CM | POA: Diagnosis not present

## 2023-01-17 DIAGNOSIS — H16223 Keratoconjunctivitis sicca, not specified as Sjogren's, bilateral: Secondary | ICD-10-CM | POA: Diagnosis not present

## 2023-01-17 DIAGNOSIS — Z79899 Other long term (current) drug therapy: Secondary | ICD-10-CM | POA: Diagnosis not present

## 2023-04-19 DIAGNOSIS — M2550 Pain in unspecified joint: Secondary | ICD-10-CM | POA: Diagnosis not present

## 2023-04-19 DIAGNOSIS — Z79899 Other long term (current) drug therapy: Secondary | ICD-10-CM | POA: Diagnosis not present

## 2023-04-19 DIAGNOSIS — M0579 Rheumatoid arthritis with rheumatoid factor of multiple sites without organ or systems involvement: Secondary | ICD-10-CM | POA: Diagnosis not present

## 2023-05-10 DIAGNOSIS — M0579 Rheumatoid arthritis with rheumatoid factor of multiple sites without organ or systems involvement: Secondary | ICD-10-CM | POA: Diagnosis not present

## 2023-05-26 DIAGNOSIS — M0579 Rheumatoid arthritis with rheumatoid factor of multiple sites without organ or systems involvement: Secondary | ICD-10-CM | POA: Diagnosis not present

## 2023-07-29 DIAGNOSIS — M818 Other osteoporosis without current pathological fracture: Secondary | ICD-10-CM | POA: Diagnosis not present

## 2023-08-18 DIAGNOSIS — E6609 Other obesity due to excess calories: Secondary | ICD-10-CM | POA: Diagnosis not present

## 2023-08-18 DIAGNOSIS — J4530 Mild persistent asthma, uncomplicated: Secondary | ICD-10-CM | POA: Diagnosis not present

## 2023-10-20 DIAGNOSIS — M0579 Rheumatoid arthritis with rheumatoid factor of multiple sites without organ or systems involvement: Secondary | ICD-10-CM | POA: Diagnosis not present

## 2023-10-20 DIAGNOSIS — M2550 Pain in unspecified joint: Secondary | ICD-10-CM | POA: Diagnosis not present

## 2023-10-20 DIAGNOSIS — Z79899 Other long term (current) drug therapy: Secondary | ICD-10-CM | POA: Diagnosis not present

## 2023-11-24 DIAGNOSIS — M0579 Rheumatoid arthritis with rheumatoid factor of multiple sites without organ or systems involvement: Secondary | ICD-10-CM | POA: Diagnosis not present

## 2024-01-05 DIAGNOSIS — M0579 Rheumatoid arthritis with rheumatoid factor of multiple sites without organ or systems involvement: Secondary | ICD-10-CM | POA: Diagnosis not present

## 2024-01-05 DIAGNOSIS — M818 Other osteoporosis without current pathological fracture: Secondary | ICD-10-CM | POA: Diagnosis not present

## 2024-01-13 DIAGNOSIS — Z0184 Encounter for antibody response examination: Secondary | ICD-10-CM | POA: Diagnosis not present

## 2024-01-13 DIAGNOSIS — E559 Vitamin D deficiency, unspecified: Secondary | ICD-10-CM | POA: Diagnosis not present

## 2024-01-13 DIAGNOSIS — M069 Rheumatoid arthritis, unspecified: Secondary | ICD-10-CM | POA: Diagnosis not present

## 2024-01-13 DIAGNOSIS — E7800 Pure hypercholesterolemia, unspecified: Secondary | ICD-10-CM | POA: Diagnosis not present

## 2024-01-13 DIAGNOSIS — M810 Age-related osteoporosis without current pathological fracture: Secondary | ICD-10-CM | POA: Diagnosis not present

## 2024-01-13 DIAGNOSIS — Z0000 Encounter for general adult medical examination without abnormal findings: Secondary | ICD-10-CM | POA: Diagnosis not present

## 2024-01-18 DIAGNOSIS — Z1231 Encounter for screening mammogram for malignant neoplasm of breast: Secondary | ICD-10-CM | POA: Diagnosis not present

## 2024-02-09 DIAGNOSIS — K219 Gastro-esophageal reflux disease without esophagitis: Secondary | ICD-10-CM | POA: Diagnosis not present

## 2024-03-14 DIAGNOSIS — D122 Benign neoplasm of ascending colon: Secondary | ICD-10-CM | POA: Diagnosis not present

## 2024-03-14 DIAGNOSIS — K219 Gastro-esophageal reflux disease without esophagitis: Secondary | ICD-10-CM | POA: Diagnosis not present

## 2024-03-14 DIAGNOSIS — Z1211 Encounter for screening for malignant neoplasm of colon: Secondary | ICD-10-CM | POA: Diagnosis not present

## 2024-03-14 DIAGNOSIS — K3189 Other diseases of stomach and duodenum: Secondary | ICD-10-CM | POA: Diagnosis not present

## 2024-03-14 DIAGNOSIS — D124 Benign neoplasm of descending colon: Secondary | ICD-10-CM | POA: Diagnosis not present

## 2024-03-14 DIAGNOSIS — K5730 Diverticulosis of large intestine without perforation or abscess without bleeding: Secondary | ICD-10-CM | POA: Diagnosis not present

## 2024-03-14 DIAGNOSIS — D125 Benign neoplasm of sigmoid colon: Secondary | ICD-10-CM | POA: Diagnosis not present

## 2024-03-14 DIAGNOSIS — K2970 Gastritis, unspecified, without bleeding: Secondary | ICD-10-CM | POA: Diagnosis not present

## 2024-04-19 DIAGNOSIS — M818 Other osteoporosis without current pathological fracture: Secondary | ICD-10-CM | POA: Diagnosis not present

## 2024-04-19 DIAGNOSIS — Z79899 Other long term (current) drug therapy: Secondary | ICD-10-CM | POA: Diagnosis not present

## 2024-04-19 DIAGNOSIS — M0579 Rheumatoid arthritis with rheumatoid factor of multiple sites without organ or systems involvement: Secondary | ICD-10-CM | POA: Diagnosis not present

## 2024-05-22 DIAGNOSIS — M0569 Rheumatoid arthritis of multiple sites with involvement of other organs and systems: Secondary | ICD-10-CM | POA: Diagnosis not present

## 2024-05-22 DIAGNOSIS — Z79899 Other long term (current) drug therapy: Secondary | ICD-10-CM | POA: Diagnosis not present
